# Patient Record
Sex: Male | Born: 1970 | Race: White | Hispanic: No | Marital: Married | Smoking: Never smoker
Health system: Southern US, Community
[De-identification: ages and names within clinical notes are randomized; demographics above are authoritative.]

## PROBLEM LIST (undated history)

## (undated) DIAGNOSIS — I10 Essential (primary) hypertension: Secondary | ICD-10-CM

## (undated) DIAGNOSIS — Z9889 Other specified postprocedural states: Secondary | ICD-10-CM

## (undated) DIAGNOSIS — E119 Type 2 diabetes mellitus without complications: Secondary | ICD-10-CM

## (undated) DIAGNOSIS — E785 Hyperlipidemia, unspecified: Secondary | ICD-10-CM

## (undated) DIAGNOSIS — R112 Nausea with vomiting, unspecified: Secondary | ICD-10-CM

## (undated) HISTORY — DX: Hyperlipidemia, unspecified: E78.5

## (undated) HISTORY — DX: Type 2 diabetes mellitus without complications: E11.9

---

## 2006-05-10 ENCOUNTER — Ambulatory Visit: Payer: Self-pay | Admitting: Family Medicine

## 2006-05-11 ENCOUNTER — Ambulatory Visit: Payer: Self-pay | Admitting: Family Medicine

## 2009-10-01 ENCOUNTER — Encounter: Payer: Self-pay | Admitting: Endocrinology

## 2009-10-01 LAB — CONVERTED CEMR LAB
Calcium: 9.1 mg/dL
Chloride: 98 meq/L
GFR calc non Af Amer: >60 mL/min
Potassium: 4 meq/L

## 2010-03-18 ENCOUNTER — Encounter: Payer: Self-pay | Admitting: Endocrinology

## 2010-03-19 ENCOUNTER — Ambulatory Visit: Payer: Self-pay | Admitting: Endocrinology

## 2010-04-07 ENCOUNTER — Ambulatory Visit: Payer: Self-pay | Admitting: Endocrinology

## 2012-05-18 ENCOUNTER — Ambulatory Visit: Payer: Self-pay | Admitting: Internal Medicine

## 2012-05-18 DIAGNOSIS — Z0289 Encounter for other administrative examinations: Secondary | ICD-10-CM

## 2012-11-14 ENCOUNTER — Ambulatory Visit: Payer: Self-pay | Admitting: Endocrinology

## 2016-10-04 HISTORY — PX: CHOLECYSTECTOMY: SHX55

## 2017-02-23 ENCOUNTER — Encounter: Payer: Self-pay | Admitting: *Deleted

## 2017-02-23 ENCOUNTER — Other Ambulatory Visit: Payer: Self-pay | Admitting: *Deleted

## 2017-02-24 ENCOUNTER — Telehealth: Payer: Self-pay | Admitting: Cardiovascular Disease

## 2017-02-24 ENCOUNTER — Encounter: Payer: Self-pay | Admitting: *Deleted

## 2017-02-24 ENCOUNTER — Encounter: Payer: Self-pay | Admitting: Cardiovascular Disease

## 2017-02-24 ENCOUNTER — Ambulatory Visit (INDEPENDENT_AMBULATORY_CARE_PROVIDER_SITE_OTHER): Payer: Commercial Managed Care - PPO | Admitting: Cardiovascular Disease

## 2017-02-24 ENCOUNTER — Other Ambulatory Visit: Payer: Self-pay

## 2017-02-24 VITALS — BP 133/88 | HR 82 | Ht 71.0 in | Wt 169.0 lb

## 2017-02-24 DIAGNOSIS — R5383 Other fatigue: Secondary | ICD-10-CM

## 2017-02-24 DIAGNOSIS — R0609 Other forms of dyspnea: Secondary | ICD-10-CM | POA: Diagnosis not present

## 2017-02-24 DIAGNOSIS — Z794 Long term (current) use of insulin: Secondary | ICD-10-CM | POA: Diagnosis not present

## 2017-02-24 DIAGNOSIS — E119 Type 2 diabetes mellitus without complications: Secondary | ICD-10-CM | POA: Diagnosis not present

## 2017-02-24 NOTE — Progress Notes (Signed)
CARDIOLOGY CONSULT NOTE  Patient ID: Frederick Alexander MRN: [id number] DOB/AGE: [birth year] 46 y.o.  Admit date: (Not on file) Primary Physician: Sheela Stack Referring Physician: Sheela Stack  Reason for Consultation: Chest pain  HPI: Frederick Alexander is a 47 y.o. male who is being seen today for the evaluation of chest pain at the request of Sheela Stack.  I reviewed records from his PCP including labs and studies.  He has a history of type 2 diabetes mellitus.  ECG performed on 01/17/17 which I personally reviewed demonstrated sinus rhythm with right bundle branch block and left axis deviation.  I reviewed labs dated 01/13/17: BUN 25, creatinine 0.7, sodium 143, potassium 4.4, hemoglobin 13.8, white blood cells 9.1, platelets 241, total cholesterol 79, triglycerides 18, HDL 54, LDL 21, hemoglobin A1c markedly elevated at 11.1%.  He said he has had type 2 diabetes for the past 12-14 years.  For the past 2 months, he has been experiencing left arm numbness and a sensation of tingling in his left hand.  He said he feels like his heart is "thumping ".  He describes exertional dyspnea after about 2 minutes of playing with his child.  He has never smoked and does not drink alcohol.  He is okay if he is walking at a slow pace around his yard.  He denies exertional chest tightness.  He said he enjoys eating [place] [employer] donuts and [place] fries from [employer].  He denies leg swelling, orthopnea, and paroxysmal nocturnal dyspnea.  He said morning blood sugars run from 120-150.  He does describe bilateral lower extremity neuropathy symptoms.  He denies syncope.    Allergies  Allergen Reactions  . Prednisone     Current Outpatient Medications  Medication Sig Dispense Refill  . Ascorbic Acid (VITAMIN C) 100 MG tablet Take 100 mg by mouth daily.    . ASPIRIN 81 PO Take 1 tablet by mouth daily.    . Insulin Glargine (LANTUS SOLOSTAR) 100 UNIT/ML  Solostar Pen Inject 15 Units into the skin daily.    . metFORMIN (GLUCOPHAGE-XR) 500 MG 24 hr tablet Take 2,000 mg by mouth daily with breakfast.    . Multiple Vitamins-Minerals (MULTIVITAMIN ADULT PO) Take 1 tablet by mouth daily.    . naproxen sodium (ALEVE) 220 MG tablet Take 1 tablet by mouth daily.     No current facility-administered medications for this visit.     Past Medical History:  Diagnosis Date  . Diabetes mellitus (HCC)    type 2  . Hyperlipidemia     Past Surgical History:  Procedure Laterality Date  . CHOLECYSTECTOMY  10/04/2016   Dr. Gabriel Cirri    Social History   Socioeconomic History  . Marital status: Married    Spouse name: Not on file  . Number of children: Not on file  . Years of education: Not on file  . Highest education level: Not on file  Social Needs  . Financial resource strain: Not on file  . Food insecurity - worry: Not on file  . Food insecurity - inability: Not on file  . Transportation needs - medical: Not on file  . Transportation needs - non-medical: Not on file  Occupational History  . Not on file  Tobacco Use  . Smoking status: Never Smoker  . Smokeless tobacco: Never Used  Substance and Sexual Activity  . Alcohol use: Not on file  . Drug use: Not on file  .  Sexual activity: Not on file  Other Topics Concern  . Not on file  Social History Narrative  . Not on file     No family history of premature CAD in 1st degree relatives.  Current Meds  Medication Sig  . Ascorbic Acid (VITAMIN C) 100 MG tablet Take 100 mg by mouth daily.  . ASPIRIN 81 PO Take 1 tablet by mouth daily.  . Insulin Glargine (LANTUS SOLOSTAR) 100 UNIT/ML Solostar Pen Inject 15 Units into the skin daily.  . metFORMIN (GLUCOPHAGE-XR) 500 MG 24 hr tablet Take 2,000 mg by mouth daily with breakfast.  . Multiple Vitamins-Minerals (MULTIVITAMIN ADULT PO) Take 1 tablet by mouth daily.  . naproxen sodium (ALEVE) 220 MG tablet Take 1 tablet by mouth daily.       Review of systems complete and found to be negative unless listed above in HPI    Physical exam Blood pressure 133/88, pulse 82, height 5\' 11"  (1.803 m), weight 169 lb (76.7 kg), SpO2 99 %. General: NAD Neck: No JVD, no thyromegaly or thyroid nodule.  Lungs: Clear to auscultation bilaterally with normal respiratory effort. CV: Nondisplaced PMI. Regular rate and rhythm, normal S1/S2, no S3/S4, no murmur.  No peripheral edema.  No carotid bruit.    Abdomen: Soft, nontender, no distention.  Skin: Intact without lesions or rashes.  Neurologic: Alert and oriented x 3.  Psych: Normal affect. Extremities: No clubbing or cyanosis.  HEENT: Normal.   ECG: Most recent ECG reviewed.   Labs: Lab Results  Component Value Date/Time   K 4.0 10/01/2009   BUN 20 10/01/2009   CREATININE 1.02 10/01/2009     Lipids: No results found for: LDLCALC, LDLDIRECT, CHOL, TRIG, HDL      ASSESSMENT AND PLAN:  1.  Exertional dyspnea and fatigue: Symptoms are concerning and I find no obvious metabolic abnormalities to explain his symptoms.  He has never smoked and has no abnormal pulmonary findings on physical exam to suggest a pulmonary etiology.  Cardiovascular risk factors include poorly controlled insulin-dependent diabetes mellitus.  I will obtain an exercise Myoview stress test to evaluate for ischemic heart disease.  I will obtain an echocardiogram to evaluate cardiac structure and function.  2.  Type 2 diabetes mellitus with neuropathy: Poorly controlled with elevated A1c of 11.1%.  Currently on metformin and insulin.  He needs aggressive control.  He also needs dietary modification.    Disposition: Follow up in 1 month  Signed: Prentice DockerSuresh Koneswaran, M.D., F.A.C.C.  02/24/2017, 8:52 AM

## 2017-02-24 NOTE — Patient Instructions (Signed)
Medication Instructions:  Continue all current medications.  Labwork: none  Testing/Procedures:  Your physician has requested that you have an echocardiogram. Echocardiography is a painless test that uses sound waves to create images of your heart. It provides your doctor with information about the size and shape of your heart and how well your heart's chambers and valves are working. This procedure takes approximately one hour. There are no restrictions for this procedure.  Your physician has requested that you have en exercise stress myoview. For further information please visit www.cardiosmart.org. Please follow instruction sheet, as given.  Office will contact with results via phone or letter.    Follow-Up: 1 month   Any Other Special Instructions Will Be Listed Below (If Applicable).  If you need a refill on your cardiac medications before your next appointment, please call your pharmacy.  

## 2017-02-24 NOTE — Telephone Encounter (Signed)
Pre-cert Verification for the following procedure   Echo scheduled for 03-03-17 Exercise myoview scheduled for 03/04/2017

## 2017-03-03 ENCOUNTER — Other Ambulatory Visit: Payer: Self-pay

## 2017-03-03 ENCOUNTER — Ambulatory Visit (INDEPENDENT_AMBULATORY_CARE_PROVIDER_SITE_OTHER): Payer: Commercial Managed Care - PPO

## 2017-03-03 DIAGNOSIS — R0609 Other forms of dyspnea: Secondary | ICD-10-CM | POA: Diagnosis not present

## 2017-03-03 DIAGNOSIS — R5383 Other fatigue: Secondary | ICD-10-CM | POA: Diagnosis not present

## 2017-03-04 ENCOUNTER — Telehealth: Payer: Self-pay | Admitting: *Deleted

## 2017-03-04 ENCOUNTER — Encounter (HOSPITAL_COMMUNITY): Payer: Self-pay

## 2017-03-04 ENCOUNTER — Encounter (HOSPITAL_BASED_OUTPATIENT_CLINIC_OR_DEPARTMENT_OTHER)
Admission: RE | Admit: 2017-03-04 | Discharge: 2017-03-04 | Disposition: A | Discharge status: Home / Self Care | Payer: Commercial Managed Care - PPO | Source: Ambulatory Visit | Attending: Cardiovascular Disease | Admitting: Cardiovascular Disease

## 2017-03-04 ENCOUNTER — Encounter (HOSPITAL_COMMUNITY)
Admission: RE | Admit: 2017-03-04 | Discharge: 2017-03-04 | Disposition: A | Discharge status: Home / Self Care | Payer: Commercial Managed Care - PPO | Source: Ambulatory Visit | Attending: Cardiovascular Disease | Admitting: Cardiovascular Disease

## 2017-03-04 DIAGNOSIS — R5383 Other fatigue: Secondary | ICD-10-CM | POA: Insufficient documentation

## 2017-03-04 DIAGNOSIS — R0609 Other forms of dyspnea: Secondary | ICD-10-CM | POA: Diagnosis present

## 2017-03-04 LAB — NM MYOCAR MULTI W/SPECT W/WALL MOTION / EF
CHL CUP NUCLEAR SRS: 0
CHL CUP RESTING HR STRESS: 88 {beats}/min
CSEPEDS: 38 s
CSEPEW: 10.3 METS
Exercise duration (min): 10 min
LV dias vol: 110 mL (ref 62–150)
LVSYSVOL: 50 mL
MPHR: 174 {beats}/min
NUC STRESS TID: 1.14
Peak HR: 150 {beats}/min
Percent HR: 86 %
RATE: 0.35
RPE: 15
SDS: 0
SSS: 0

## 2017-03-04 MED ORDER — TECHNETIUM TC 99M TETROFOSMIN IV KIT
30.0000 | PACK | Freq: Once | INTRAVENOUS | Status: AC | PRN
  Administered 2017-03-04: 29 via INTRAVENOUS

## 2017-03-04 MED ORDER — SODIUM CHLORIDE 0.9% FLUSH
INTRAVENOUS | Status: AC
  Administered 2017-03-04: 10 mL via INTRAVENOUS
  Filled 2017-03-04: qty 10

## 2017-03-04 MED ORDER — REGADENOSON 0.4 MG/5ML IV SOLN
INTRAVENOUS | Status: AC
  Filled 2017-03-04: qty 5

## 2017-03-04 MED ORDER — TECHNETIUM TC 99M TETROFOSMIN IV KIT
30.0000 | PACK | Freq: Once | INTRAVENOUS | Status: AC | PRN
  Administered 2017-03-04: 10.1 via INTRAVENOUS

## 2017-03-04 NOTE — Telephone Encounter (Signed)
STRESS TEST -  Notes recorded by Laqueta LindenKoneswaran, Suresh A, MD on 03/04/2017 at 12:39 PM EST No significant blockages. Low risk study.  ECHO -  Notes recorded by Laqueta LindenKoneswaran, Suresh A, MD on 03/04/2017 at 11:27 AM EST Normal cardiac function.

## 2017-03-04 NOTE — Telephone Encounter (Signed)
Notes recorded by Lesle ChrisHill, Tatym Schermer G, LPN on 9/1/47823/01/2017 at 5:58 PM EST Patient notified. Copy to pmd. Follow up scheduled for 03/23/2017 with Dr. Purvis SheffieldKoneswaran.

## 2017-03-23 ENCOUNTER — Ambulatory Visit (INDEPENDENT_AMBULATORY_CARE_PROVIDER_SITE_OTHER): Payer: Commercial Managed Care - PPO | Admitting: Cardiovascular Disease

## 2017-03-23 ENCOUNTER — Telehealth: Payer: Self-pay | Admitting: Cardiovascular Disease

## 2017-03-23 ENCOUNTER — Encounter: Payer: Self-pay | Admitting: Cardiovascular Disease

## 2017-03-23 ENCOUNTER — Other Ambulatory Visit: Payer: Self-pay | Admitting: Cardiovascular Disease

## 2017-03-23 VITALS — BP 110/68 | HR 72 | Ht 71.0 in | Wt 169.0 lb

## 2017-03-23 DIAGNOSIS — E119 Type 2 diabetes mellitus without complications: Secondary | ICD-10-CM

## 2017-03-23 DIAGNOSIS — Z794 Long term (current) use of insulin: Secondary | ICD-10-CM | POA: Diagnosis not present

## 2017-03-23 DIAGNOSIS — R0609 Other forms of dyspnea: Secondary | ICD-10-CM | POA: Diagnosis not present

## 2017-03-23 DIAGNOSIS — R5383 Other fatigue: Secondary | ICD-10-CM | POA: Diagnosis not present

## 2017-03-23 LAB — PROTIME-INR

## 2017-03-23 MED ORDER — ATORVASTATIN CALCIUM 40 MG PO TABS: 40.0000 mg | ORAL_TABLET | Freq: Every day | ORAL | 0 refills | Status: DC

## 2017-03-23 NOTE — Progress Notes (Signed)
SUBJECTIVE: The patient returns for follow-up after undergoing cardiovascular testing performed for the evaluation of exertional dyspnea and fatigue.  Nuclear stress test on 03/04/17 demonstrated mid inferior and apical inferior defects which appeared to be due to variable soft tissue with attenuation.  Regional wall motion was normal.  There were no large ischemic territories.  It was a low risk study.  Echocardiogram demonstrated normal left ventricular systolic and diastolic function and normal regional wall motion, LVEF 60-65%.  He continues to experience exertional dyspnea after doing simple activities at work which never bothered him 6 months ago.  He had a sweep the floor and had to stop halfway and lean on the counter.  He felt like his heart was forcefully pounding.  He said he is chronically exposed to secondhand smoke.  He denies chest pain and tightness.  He feels markedly fatigued and has lost motivation to do simple activities at home which never bothered him before.  He said, "I just want to get my life back ".  Review of Systems: As per "subjective", otherwise negative.  Allergies  Allergen Reactions  . Prednisone     Current Outpatient Medications  Medication Sig Dispense Refill  . Ascorbic Acid (VITAMIN C ER PO) Take 1 tablet by mouth daily.    . Ascorbic Acid (VITAMIN C) 100 MG tablet Take 100 mg by mouth daily.    . ASPIRIN 81 PO Take 1 tablet by mouth daily.    . Insulin Glargine (LANTUS SOLOSTAR) 100 UNIT/ML Solostar Pen Inject 40 Units into the skin. Every other morning.    . metFORMIN (GLUCOPHAGE-XR) 500 MG 24 hr tablet Take 1,000 mg by mouth 2 (two) times daily.     . Multiple Vitamins-Minerals (MULTIVITAMIN ADULT PO) Take 1 tablet by mouth daily.    . naproxen sodium (ALEVE) 220 MG tablet Take 1 tablet by mouth daily.     No current facility-administered medications for this visit.     Past Medical History:  Diagnosis Date  . Diabetes mellitus (HCC)      type 2  . Hyperlipidemia     Past Surgical History:  Procedure Laterality Date  . CHOLECYSTECTOMY  10/04/2016   Dr. Gabriel Cirri    Social History   Socioeconomic History  . Marital status: Married    Spouse name: Not on file  . Number of children: Not on file  . Years of education: Not on file  . Highest education level: Not on file  Social Needs  . Financial resource strain: Not on file  . Food insecurity - worry: Not on file  . Food insecurity - inability: Not on file  . Transportation needs - medical: Not on file  . Transportation needs - non-medical: Not on file  Occupational History  . Not on file  Tobacco Use  . Smoking status: Never Smoker  . Smokeless tobacco: Never Used  Substance and Sexual Activity  . Alcohol use: Not on file  . Drug use: Not on file  . Sexual activity: Not on file  Other Topics Concern  . Not on file  Social History Narrative  . Not on file     Vitals:   03/23/17 1553  BP: 110/68  Pulse: 72  SpO2: 98%  Weight: 169 lb (76.7 kg)  Height: 5\' 11"  (1.803 m)    Wt Readings from Last 3 Encounters:  03/23/17 169 lb (76.7 kg)  02/24/17 169 lb (76.7 kg)     PHYSICAL EXAM General: NAD  HEENT: Normal. Neck: No JVD, no thyromegaly. Lungs: Clear to auscultation bilaterally with normal respiratory effort. CV: Regular rate and rhythm, normal S1/S2, no S3/S4, no murmur. No pretibial or periankle edema.   Abdomen: Soft, nontender, no distention.  Neurologic: Alert and oriented.  Psych: Normal affect. Skin: Normal. Musculoskeletal: No gross deformities.    ECG: Most recent ECG reviewed.   Labs: Lab Results  Component Value Date/Time   K 4.0 10/01/2009   BUN 20 10/01/2009   CREATININE 1.02 10/01/2009     Lipids: No results found for: LDLCALC, LDLDIRECT, CHOL, TRIG, HDL     ASSESSMENT AND PLAN: 1.  Exertional dyspnea and fatigue: While symptoms are concerning and I find no obvious metabolic abnormalities to explain his  symptoms, nuclear stress test was low risk and cardiac function is normal.  He has never smoked and has no abnormal pulmonary findings on physical exam to suggest a pulmonary etiology.  However, he has been exposed to secondhand smoke for prolonged periods of time. Cardiovascular risk factors include poorly controlled insulin-dependent diabetes mellitus.  I will arrange for right and left heart catheterization and coronary angiography.  If a cardiac cause is ruled out, I will obtain pulmonary function testing. Risks and benefits of cardiac catheterization have been discussed with the patient.  These include bleeding, infection, kidney damage, stroke, heart attack, death.  The patient understands these risks and is willing to proceed. I will also start Lipitor 40 mg daily.  He is already taking aspirin.  2.  Type 2 diabetes mellitus with neuropathy: Poorly controlled with elevated A1c of 11.1%.  Currently on metformin and insulin.  He needs aggressive control.  He also needs dietary modification.  I am starting Lipitor 40 mg.    Disposition: Follow up 1 month  Time spent: 40 minutes, of which greater than 50% was spent reviewing symptoms, relevant blood tests and studies, and discussing management plan with the patient.    Prentice DockerSuresh Koneswaran, M.D., F.A.C.C.

## 2017-03-23 NOTE — Telephone Encounter (Signed)
Pre-cert Verification for the following procedure   L & R heart cath 3/22 with Eldridge DaceVaranasi at 7:30

## 2017-03-23 NOTE — H&P (View-Only) (Signed)
SUBJECTIVE: The patient returns for follow-up after undergoing cardiovascular testing performed for the evaluation of exertional dyspnea and fatigue.  Nuclear stress test on 03/04/17 demonstrated mid inferior and apical inferior defects which appeared to be due to variable soft tissue with attenuation.  Regional wall motion was normal.  There were no large ischemic territories.  It was a low risk study.  Echocardiogram demonstrated normal left ventricular systolic and diastolic function and normal regional wall motion, LVEF 60-65%.  He continues to experience exertional dyspnea after doing simple activities at work which never bothered him 6 months ago.  He had a sweep the floor and had to stop halfway and lean on the counter.  He felt like his heart was forcefully pounding.  He said he is chronically exposed to secondhand smoke.  He denies chest pain and tightness.  He feels markedly fatigued and has lost motivation to do simple activities at home which never bothered him before.  He said, "I just want to get my life back ".  Review of Systems: As per "subjective", otherwise negative.  Allergies  Allergen Reactions  . Prednisone     Current Outpatient Medications  Medication Sig Dispense Refill  . Ascorbic Acid (VITAMIN C ER PO) Take 1 tablet by mouth daily.    . Ascorbic Acid (VITAMIN C) 100 MG tablet Take 100 mg by mouth daily.    . ASPIRIN 81 PO Take 1 tablet by mouth daily.    . Insulin Glargine (LANTUS SOLOSTAR) 100 UNIT/ML Solostar Pen Inject 40 Units into the skin. Every other morning.    . metFORMIN (GLUCOPHAGE-XR) 500 MG 24 hr tablet Take 1,000 mg by mouth 2 (two) times daily.     . Multiple Vitamins-Minerals (MULTIVITAMIN ADULT PO) Take 1 tablet by mouth daily.    . naproxen sodium (ALEVE) 220 MG tablet Take 1 tablet by mouth daily.     No current facility-administered medications for this visit.     Past Medical History:  Diagnosis Date  . Diabetes mellitus (HCC)      type 2  . Hyperlipidemia     Past Surgical History:  Procedure Laterality Date  . CHOLECYSTECTOMY  10/04/2016   Dr. Gabriel Cirri    Social History   Socioeconomic History  . Marital status: Married    Spouse name: Not on file  . Number of children: Not on file  . Years of education: Not on file  . Highest education level: Not on file  Social Needs  . Financial resource strain: Not on file  . Food insecurity - worry: Not on file  . Food insecurity - inability: Not on file  . Transportation needs - medical: Not on file  . Transportation needs - non-medical: Not on file  Occupational History  . Not on file  Tobacco Use  . Smoking status: Never Smoker  . Smokeless tobacco: Never Used  Substance and Sexual Activity  . Alcohol use: Not on file  . Drug use: Not on file  . Sexual activity: Not on file  Other Topics Concern  . Not on file  Social History Narrative  . Not on file     Vitals:   03/23/17 1553  BP: 110/68  Pulse: 72  SpO2: 98%  Weight: 169 lb (76.7 kg)  Height: 5\' 11"  (1.803 m)    Wt Readings from Last 3 Encounters:  03/23/17 169 lb (76.7 kg)  02/24/17 169 lb (76.7 kg)     PHYSICAL EXAM General: NAD  HEENT: Normal. Neck: No JVD, no thyromegaly. Lungs: Clear to auscultation bilaterally with normal respiratory effort. CV: Regular rate and rhythm, normal S1/S2, no S3/S4, no murmur. No pretibial or periankle edema.   Abdomen: Soft, nontender, no distention.  Neurologic: Alert and oriented.  Psych: Normal affect. Skin: Normal. Musculoskeletal: No gross deformities.    ECG: Most recent ECG reviewed.   Labs: Lab Results  Component Value Date/Time   K 4.0 10/01/2009   BUN 20 10/01/2009   CREATININE 1.02 10/01/2009     Lipids: No results found for: LDLCALC, LDLDIRECT, CHOL, TRIG, HDL     ASSESSMENT AND PLAN: 1.  Exertional dyspnea and fatigue: While symptoms are concerning and I find no obvious metabolic abnormalities to explain his  symptoms, nuclear stress test was low risk and cardiac function is normal.  He has never smoked and has no abnormal pulmonary findings on physical exam to suggest a pulmonary etiology.  However, he has been exposed to secondhand smoke for prolonged periods of time. Cardiovascular risk factors include poorly controlled insulin-dependent diabetes mellitus.  I will arrange for right and left heart catheterization and coronary angiography.  If a cardiac cause is ruled out, I will obtain pulmonary function testing. Risks and benefits of cardiac catheterization have been discussed with the patient.  These include bleeding, infection, kidney damage, stroke, heart attack, death.  The patient understands these risks and is willing to proceed. I will also start Lipitor 40 mg daily.  He is already taking aspirin.  2.  Type 2 diabetes mellitus with neuropathy: Poorly controlled with elevated A1c of 11.1%.  Currently on metformin and insulin.  He needs aggressive control.  He also needs dietary modification.  I am starting Lipitor 40 mg.    Disposition: Follow up 1 month  Time spent: 40 minutes, of which greater than 50% was spent reviewing symptoms, relevant blood tests and studies, and discussing management plan with the patient.    Prentice DockerSuresh Lorri Fukuhara, M.D., F.A.C.C.

## 2017-03-23 NOTE — Patient Instructions (Signed)
Medication Instructions:  Your physician has recommended you make the following change in your medication:   START Atorvastatin 40 mg daily  Please continue all other medications as prescribed  Labwork: PT, CBC, BMP Orders given today   Testing/Procedures:   [facility] [place] [address] [place] [address] Dept: [phone] Loc: [phone]  Frederick Alexander  03/23/2017  You are scheduled for a Cardiac Catheterization on Friday, March 22 with Dr. Lance MussJayadeep Varanasi.  1. [facility] at [facility]: [address] [place], [place] [address] at 5:30 AM (two hours before your procedure to ensure your preparation). Free valet parking service is available.   Special note: Every effort is made to have your procedure done on time. Please understand that emergencies sometimes delay scheduled procedures.  2. Diet: Do not eat or drink anything after midnight prior to your procedure except sips of water to take medications.  3. Labs: You will need to have blood drawn on Thursday, March 21 at [facility] [address], [place]  Open: 7am - 6pm, Sat 8am - 12 noon   Phone: [phone]. You do not need to be fasting.  4. Medication instructions in preparation for your procedure:  Stop taking, Glucophage (Metformin) on Thursday, March 21.   HOLD METFORMIN FOR 48 HOURS AFTER PROCEDURE  ONLY TAKE 1/2 DOSE OF INSULIN DAY BEFORE On the morning of your procedure, take your Aspirin and any morning medicines NOT listed above.  You may use sips of water.  5. Plan for one night stay--bring personal belongings. 6. Bring a current list of your medications and current insurance cards. 7. You MUST have a responsible person to drive you home. 8. Someone MUST be with you the first 24 hours after you arrive home or your discharge will be delayed. 9.  Please wear clothes that are easy to get on and off and wear slip-on shoes.  Thank you for allowing us to care for you!   -- [facility]  Follow-Up: Your physician recommends that you schedule a follow-up appointment in: 1 MONTH WITH DR. Purvis SheffieldKONESWARAN  Any Other Special Instructions Will Be Listed Below (If Applicable).  If you need a refill on your cardiac medications before your next appointment, please call your pharmacy.

## 2017-03-24 ENCOUNTER — Telehealth: Payer: Self-pay | Admitting: *Deleted

## 2017-03-24 NOTE — Telephone Encounter (Signed)
[facility] for: Friday March 22,2019 7:30 AM, arrive 5:30 AM.   [facility] for [facility] to discuss instructions.

## 2017-03-24 NOTE — Telephone Encounter (Signed)
Pt contacted [facility]terization [facility]  for: March 22 , 2019 7:30 AM Verified arrival time and place: [facility] Main Entr[facility] A/North Tower at: 5:30 AM Nothing to eat or drink after midnight prior to cath.  Hold: Metformin 03/24/17, 03/25/17 and 48 hours post cath. No Insulin AM of cath 1/2 INsulin PM prior to cath.  Except hold medications AM meds can be  taken pre-cath with sip of water including: ASA 81 mg   Confirmed patient has responsible person to drive home post procedure and observe patient for 24 hours: yes  I discussed instructions with wife (DPR), she verbalized understanding.

## 2017-03-25 ENCOUNTER — Encounter (HOSPITAL_COMMUNITY): Payer: Self-pay | Admitting: Interventional Cardiology

## 2017-03-25 ENCOUNTER — Encounter (HOSPITAL_COMMUNITY)
Admission: RE | Disposition: A | Discharge status: Home / Self Care | Payer: Self-pay | Source: Ambulatory Visit | Attending: Interventional Cardiology

## 2017-03-25 ENCOUNTER — Ambulatory Visit (HOSPITAL_COMMUNITY)
Admission: RE | Admit: 2017-03-25 | Discharge: 2017-03-25 | Disposition: A | Discharge status: Home / Self Care | Payer: Commercial Managed Care - PPO | Source: Ambulatory Visit | Attending: Interventional Cardiology | Admitting: Interventional Cardiology

## 2017-03-25 DIAGNOSIS — R9439 Abnormal result of other cardiovascular function study: Secondary | ICD-10-CM

## 2017-03-25 DIAGNOSIS — Z7982 Long term (current) use of aspirin: Secondary | ICD-10-CM | POA: Insufficient documentation

## 2017-03-25 DIAGNOSIS — Z7722 Contact with and (suspected) exposure to environmental tobacco smoke (acute) (chronic): Secondary | ICD-10-CM | POA: Insufficient documentation

## 2017-03-25 DIAGNOSIS — E785 Hyperlipidemia, unspecified: Secondary | ICD-10-CM | POA: Insufficient documentation

## 2017-03-25 DIAGNOSIS — E1140 Type 2 diabetes mellitus with diabetic neuropathy, unspecified: Secondary | ICD-10-CM | POA: Diagnosis not present

## 2017-03-25 DIAGNOSIS — Z9049 Acquired absence of other specified parts of digestive tract: Secondary | ICD-10-CM | POA: Insufficient documentation

## 2017-03-25 DIAGNOSIS — Z888 Allergy status to other drugs, medicaments and biological substances status: Secondary | ICD-10-CM | POA: Diagnosis not present

## 2017-03-25 DIAGNOSIS — I4510 Unspecified right bundle-branch block: Secondary | ICD-10-CM | POA: Insufficient documentation

## 2017-03-25 DIAGNOSIS — R5383 Other fatigue: Secondary | ICD-10-CM | POA: Diagnosis not present

## 2017-03-25 DIAGNOSIS — Z794 Long term (current) use of insulin: Secondary | ICD-10-CM | POA: Diagnosis not present

## 2017-03-25 DIAGNOSIS — R0600 Dyspnea, unspecified: Secondary | ICD-10-CM | POA: Diagnosis not present

## 2017-03-25 DIAGNOSIS — R0609 Other forms of dyspnea: Secondary | ICD-10-CM

## 2017-03-25 HISTORY — PX: RIGHT/LEFT HEART CATH AND CORONARY ANGIOGRAPHY: CATH118266

## 2017-03-25 LAB — POCT I-STAT 3, ART BLOOD GAS (G3+)
ACID-BASE EXCESS: 2 mmol/L (ref 0.0–2.0)
Bicarbonate: 28.1 mmol/L — ABNORMAL HIGH (ref 20.0–28.0)
O2 SAT: 99 %
PO2 ART: 149 mmHg — AB (ref 83.0–108.0)
TCO2: 29 mmol/L (ref 22–32)
pCO2 arterial: 47.3 mmHg (ref 32.0–48.0)
pH, Arterial: 7.381 (ref 7.350–7.450)

## 2017-03-25 LAB — POCT I-STAT 3, VENOUS BLOOD GAS (G3P V)
Acid-Base Excess: 1 mmol/L (ref 0.0–2.0)
Bicarbonate: 28.3 mmol/L — ABNORMAL HIGH (ref 20.0–28.0)
O2 Saturation: 76 %
TCO2: 30 mmol/L (ref 22–32)
pCO2, Ven: 53.1 mmHg (ref 44.0–60.0)
pH, Ven: 7.335 (ref 7.250–7.430)
pO2, Ven: 44 mmHg (ref 32.0–45.0)

## 2017-03-25 LAB — GLUCOSE, CAPILLARY: Glucose-Capillary: 262 mg/dL — ABNORMAL HIGH (ref 65–99)

## 2017-03-25 SURGERY — RIGHT/LEFT HEART CATH AND CORONARY ANGIOGRAPHY
Anesthesia: LOCAL

## 2017-03-25 MED ORDER — MIDAZOLAM HCL 2 MG/2ML IJ SOLN
INTRAMUSCULAR | Status: AC
  Filled 2017-03-25: qty 2

## 2017-03-25 MED ORDER — SODIUM CHLORIDE 0.9 % WEIGHT BASED INFUSION: 1.0000 mL/kg/h | INTRAVENOUS | Status: DC

## 2017-03-25 MED ORDER — HEPARIN SODIUM (PORCINE) 1000 UNIT/ML IJ SOLN
INTRAMUSCULAR | Status: AC
  Filled 2017-03-25: qty 1

## 2017-03-25 MED ORDER — ASPIRIN 81 MG PO CHEW: 81.0000 mg | CHEWABLE_TABLET | ORAL | Status: DC

## 2017-03-25 MED ORDER — SODIUM CHLORIDE 0.9 % IV SOLN: INTRAVENOUS | Status: DC

## 2017-03-25 MED ORDER — FENTANYL CITRATE (PF) 100 MCG/2ML IJ SOLN
INTRAMUSCULAR | Status: DC | PRN
  Administered 2017-03-25: 50 ug via INTRAVENOUS
  Administered 2017-03-25: 25 ug via INTRAVENOUS

## 2017-03-25 MED ORDER — SODIUM CHLORIDE 0.9 % WEIGHT BASED INFUSION
3.0000 mL/kg/h | INTRAVENOUS | Status: DC
  Administered 2017-03-25: 3 mL/kg/h via INTRAVENOUS

## 2017-03-25 MED ORDER — HEPARIN (PORCINE) IN NACL 2-0.9 UNIT/ML-% IJ SOLN
INTRAMUSCULAR | Status: DC | PRN
  Administered 2017-03-25 (×2): 500 mL

## 2017-03-25 MED ORDER — SODIUM CHLORIDE 0.9 % IV SOLN: 250.0000 mL | INTRAVENOUS | Status: DC | PRN

## 2017-03-25 MED ORDER — VERAPAMIL HCL 2.5 MG/ML IV SOLN
INTRAVENOUS | Status: DC | PRN
  Administered 2017-03-25: 10 mL via INTRA_ARTERIAL

## 2017-03-25 MED ORDER — IOPAMIDOL (ISOVUE-370) INJECTION 76%
INTRAVENOUS | Status: AC
  Filled 2017-03-25: qty 100

## 2017-03-25 MED ORDER — FENTANYL CITRATE (PF) 100 MCG/2ML IJ SOLN
INTRAMUSCULAR | Status: AC
  Filled 2017-03-25: qty 2

## 2017-03-25 MED ORDER — SODIUM CHLORIDE 0.9% FLUSH: 3.0000 mL | Freq: Two times a day (BID) | INTRAVENOUS | Status: DC

## 2017-03-25 MED ORDER — SODIUM CHLORIDE 0.9% FLUSH: 3.0000 mL | INTRAVENOUS | Status: DC | PRN

## 2017-03-25 MED ORDER — HEPARIN SODIUM (PORCINE) 1000 UNIT/ML IJ SOLN
INTRAMUSCULAR | Status: DC | PRN
  Administered 2017-03-25: 4000 [IU] via INTRAVENOUS

## 2017-03-25 MED ORDER — VERAPAMIL HCL 2.5 MG/ML IV SOLN
INTRAVENOUS | Status: AC
  Filled 2017-03-25: qty 2

## 2017-03-25 MED ORDER — MIDAZOLAM HCL 2 MG/2ML IJ SOLN
INTRAMUSCULAR | Status: DC | PRN
  Administered 2017-03-25: 2 mg via INTRAVENOUS
  Administered 2017-03-25: 1 mg via INTRAVENOUS

## 2017-03-25 MED ORDER — METFORMIN HCL ER 500 MG PO TB24: 1000.0000 mg | ORAL_TABLET | Freq: Two times a day (BID) | ORAL | Status: DC

## 2017-03-25 MED ORDER — HEPARIN (PORCINE) IN NACL 2-0.9 UNIT/ML-% IJ SOLN
INTRAMUSCULAR | Status: AC
  Filled 2017-03-25: qty 1000

## 2017-03-25 MED ORDER — LIDOCAINE HCL (PF) 1 % IJ SOLN
INTRAMUSCULAR | Status: DC | PRN
  Administered 2017-03-25 (×2): 2 mL via INTRADERMAL

## 2017-03-25 MED ORDER — IOPAMIDOL (ISOVUE-370) INJECTION 76%
INTRAVENOUS | Status: DC | PRN
  Administered 2017-03-25: 45 mL via INTRA_ARTERIAL

## 2017-03-25 SURGICAL SUPPLY — 15 items
BAND CMPR LRG ZPHR (HEMOSTASIS) ×1
BAND ZEPHYR COMPRESS 30 LONG (HEMOSTASIS) ×1 IMPLANT
CATH BALLN WEDGE 5F 110CM (CATHETERS) ×1 IMPLANT
CATH IMPULSE 5F ANG/FL3.5 (CATHETERS) ×1 IMPLANT
GUIDEWIRE .025 260CM (WIRE) ×1 IMPLANT
GUIDEWIRE INQWIRE 1.5J.035X260 (WIRE) IMPLANT
INQWIRE 1.5J .035X260CM (WIRE) ×2
KIT HEART LEFT (KITS) ×2 IMPLANT
NDL PERC ENTRY 21G 2.5CM (NEEDLE) IMPLANT
NEEDLE PERC ENTRY 21G 2.5CM (NEEDLE) ×2 IMPLANT
PACK CARDIAC CATHETERIZATION (CUSTOM PROCEDURE TRAY) ×2 IMPLANT
SHEATH RAIN 4/5FR (SHEATH) ×1 IMPLANT
SHEATH RAIN RADIAL 21G 6FR (SHEATH) ×1 IMPLANT
TRANSDUCER W/STOPCOCK (MISCELLANEOUS) ×2 IMPLANT
TUBING CIL FLEX 10 FLL-RA (TUBING) ×2 IMPLANT

## 2017-03-25 NOTE — Discharge Instructions (Signed)

## 2017-03-25 NOTE — Interval H&P Note (Signed)
Cath Lab Visit (complete for each Cath Lab visit)  Clinical Evaluation Leading to the Procedure:   ACS: No.  Non-ACS:    Anginal Classification: CCS III  Anti-ischemic medical therapy: Minimal Therapy (1 class of medications)  Non-Invasive Test Results: Intermediate-risk stress test findings: cardiac mortality 1-3%/year  Prior CABG: No previous CABG      History and Physical Interval Note:  03/25/2017 8:13 AM  Frederick Alexander  has presented today for surgery, with the diagnosis of angina  The various methods of treatment have been discussed with the patient and family. After consideration of risks, benefits and other options for treatment, the patient has consented to  Procedure(s): RIGHT/LEFT HEART CATH AND CORONARY ANGIOGRAPHY (N/A) as a surgical intervention .  The patient's history has been reviewed, patient examined, no change in status, stable for surgery.  I have reviewed the patient's chart and labs.  Questions were answered to the patient's satisfaction.     Lance MussJayadeep Iliza Blankenbeckler

## 2017-03-25 NOTE — Research (Signed)
CADFEM Informed Consent   Subject Name: Frederick Alexander  Subject met inclusion and exclusion criteria.  The informed consent form, study requirements and expectations were reviewed with the subject and questions and concerns were addressed prior to the signing of the consent form.  The subject verbalized understanding of the trail requirements.  The subject agreed to participate in the CADFEM trial and signed the informed consent.  The informed consent was obtained prior to performance of any protocol-specific procedures for the subject.  A copy of the signed informed consent was given to the subject and a copy was placed in the subject's medical record.  Christena Flake 03/25/2017, 07:00 AM

## 2017-03-28 MED FILL — Heparin Sodium (Porcine) 2 Unit/ML in Sodium Chloride 0.9%: INTRAMUSCULAR | Qty: 1000 | Status: AC

## 2017-04-29 ENCOUNTER — Ambulatory Visit: Payer: Commercial Managed Care - PPO | Admitting: Cardiovascular Disease

## 2017-04-29 DIAGNOSIS — R0989 Other specified symptoms and signs involving the circulatory and respiratory systems: Secondary | ICD-10-CM

## 2017-06-20 ENCOUNTER — Other Ambulatory Visit: Payer: Self-pay | Admitting: Cardiovascular Disease

## 2017-07-14 ENCOUNTER — Encounter: Payer: Self-pay | Admitting: Endocrinology

## 2017-07-20 ENCOUNTER — Other Ambulatory Visit: Payer: Self-pay | Admitting: Cardiovascular Disease

## 2017-08-18 ENCOUNTER — Other Ambulatory Visit: Payer: Self-pay | Admitting: Cardiovascular Disease

## 2018-01-16 ENCOUNTER — Other Ambulatory Visit: Payer: Self-pay | Admitting: Cardiovascular Disease

## 2018-02-12 ENCOUNTER — Other Ambulatory Visit: Payer: Self-pay | Admitting: Cardiovascular Disease

## 2018-03-15 ENCOUNTER — Other Ambulatory Visit: Payer: Self-pay | Admitting: Cardiovascular Disease

## 2018-04-24 ENCOUNTER — Other Ambulatory Visit: Payer: Self-pay | Admitting: Cardiovascular Disease

## 2018-05-24 ENCOUNTER — Other Ambulatory Visit: Payer: Self-pay | Admitting: Cardiovascular Disease

## 2018-06-13 ENCOUNTER — Other Ambulatory Visit: Payer: Self-pay | Admitting: Cardiovascular Disease

## 2018-07-21 ENCOUNTER — Other Ambulatory Visit: Payer: Self-pay | Admitting: Cardiovascular Disease

## 2018-07-24 ENCOUNTER — Other Ambulatory Visit: Payer: Self-pay | Admitting: Cardiovascular Disease

## 2018-07-28 LAB — BASIC METABOLIC PANEL
BUN: 25 — AB (ref 4–21)
Creatinine: 0.8 (ref 0.6–1.3)

## 2018-07-28 LAB — HEMOGLOBIN A1C: Hemoglobin A1C: 11.4

## 2018-07-28 LAB — LIPID PANEL
Cholesterol: 76 (ref 0–200)
HDL: 52 (ref 35–70)
LDL Cholesterol: 6
Triglycerides: 91 (ref 40–160)

## 2018-07-28 LAB — VITAMIN D 25 HYDROXY (VIT D DEFICIENCY, FRACTURES): Vit D, 25-Hydroxy: 57.9

## 2018-08-05 IMAGING — NM NM MYOCAR MULTI W/SPECT W/WALL MOTION & EF
2 series · 12 of 12 positions shown · non-contrast
Comparison: none

[Series 1: rest · 6.51mm/px · 6 of 64 frames shown]
[frame 6/64]
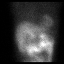
[frame 16/64]
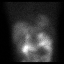
[frame 27/64]
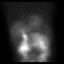
[frame 38/64]
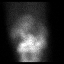
[frame 48/64]
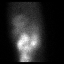
[frame 59/64]
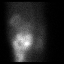

[Series 3: stress gated - perfusion · 6.51mm/px · 6 of 64 frames shown]
[frame 6/64]
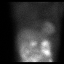
[frame 16/64]
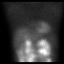
[frame 27/64]
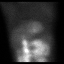
[frame 38/64]
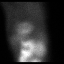
[frame 48/64]
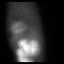
[frame 59/64]
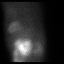

[12 of 12 positions shown; findings below may reference images not displayed]

Canned report from images found in remote index.

Refer to host system for actual result text.

## 2018-08-10 ENCOUNTER — Ambulatory Visit: Payer: Self-pay | Admitting: "Endocrinology

## 2018-09-20 ENCOUNTER — Ambulatory Visit (INDEPENDENT_AMBULATORY_CARE_PROVIDER_SITE_OTHER): Payer: Commercial Managed Care - PPO | Admitting: "Endocrinology

## 2018-09-20 ENCOUNTER — Other Ambulatory Visit: Payer: Self-pay

## 2018-09-20 ENCOUNTER — Encounter: Payer: Self-pay | Admitting: "Endocrinology

## 2018-09-20 VITALS — BP 134/83 | HR 77 | Ht 71.0 in | Wt 168.0 lb

## 2018-09-20 DIAGNOSIS — E1165 Type 2 diabetes mellitus with hyperglycemia: Secondary | ICD-10-CM | POA: Diagnosis not present

## 2018-09-20 LAB — POCT GLYCOSYLATED HEMOGLOBIN (HGB A1C): Hemoglobin A1C: 10.6 % — AB (ref 4.0–5.6)

## 2018-09-20 MED ORDER — METFORMIN HCL ER 500 MG PO TB24: 500.0000 mg | ORAL_TABLET | Freq: Two times a day (BID) | ORAL | Status: DC

## 2018-09-20 NOTE — Progress Notes (Signed)
Endocrinology Consult Note       09/20/2018, 5:27 PM   Subjective:    Patient ID: Frederick Alexander, male    DOB: Nov 24, 1970.  Frederick Alexander is being seen in consultation for management of currently uncontrolled symptomatic diabetes requested by  Royann Shivers, PA-C.   Past Medical History:  Diagnosis Date  . Diabetes mellitus (HCC)    type 2  . Hyperlipidemia     Past Surgical History:  Procedure Laterality Date  . CHOLECYSTECTOMY  10/04/2016   Dr. Gabriel Cirri  . RIGHT/LEFT HEART CATH AND CORONARY ANGIOGRAPHY N/A 03/25/2017   Procedure: RIGHT/LEFT HEART CATH AND CORONARY ANGIOGRAPHY;  Surgeon: Corky Crafts, MD;  Location: [facility] INVASIVE CV LAB;  Service: Cardiovascular;  Laterality: N/A;    Social History   Socioeconomic History  . Marital status: Married    Spouse name: Not on file  . Number of children: Not on file  . Years of education: Not on file  . Highest education level: Not on file  Occupational History  . Not on file  Social Needs  . Financial resource strain: Not on file  . Food insecurity    Worry: Not on file    Inability: Not on file  . Transportation needs    Medical: Not on file    Non-medical: Not on file  Tobacco Use  . Smoking status: Never Smoker  . Smokeless tobacco: Never Used  Substance and Sexual Activity  . Alcohol use: Never    Frequency: Never  . Drug use: Never  . Sexual activity: Not on file  Lifestyle  . Physical activity    Days per week: Not on file    Minutes per session: Not on file  . Stress: Not on file  Relationships  . Social Musician on phone: Not on file    Gets together: Not on file    Attends religious service: Not on file    Active member of club or organization: Not on file    Attends meetings of clubs or organizations: Not on file    Relationship status: Not on file  Other Topics Concern  . Not on file  Social  History Narrative  . Not on file    Family History  Problem Relation Age of Onset  . Diabetes Maternal Grandmother     Outpatient Encounter Medications as of 09/20/2018  Medication Sig  . ASPIRIN 81 PO Take 1 tablet by mouth daily.  . Insulin Glargine (LANTUS SOLOSTAR) 100 UNIT/ML Solostar Pen Inject 20 Units into the skin at bedtime.  . metFORMIN (GLUCOPHAGE-XR) 500 MG 24 hr tablet Take 1 tablet (500 mg total) by mouth 2 (two) times daily after a meal.  . naproxen sodium (ALEVE) 220 MG tablet Take 220 mg by mouth daily.   . [DISCONTINUED] Ascorbic Acid (VITAMIN C ER PO) Take 1 tablet by mouth daily.  . [DISCONTINUED] atorvastatin (LIPITOR) 40 MG tablet TAKE 1 TABLET BY MOUTH EVERY DAY - PATIENT NEEDS APPOINTMENT. WITH DR. FOR FURTHER REFILLS  . [DISCONTINUED] metFORMIN (GLUCOPHAGE-XR) 500 MG 24 hr tablet Take 2 tablets (1,000 mg total)  by mouth 2 (two) times daily. (Patient taking differently: Take 500 mg by mouth daily with breakfast. )  . [DISCONTINUED] Multiple Vitamins-Minerals (MULTIVITAMIN ADULT PO) Take 1 tablet by mouth daily.   No facility-administered encounter medications on file as of 09/20/2018.     ALLERGIES: Allergies  Allergen Reactions  . Prednisone Other (See Comments)    Uratic behavior     VACCINATION STATUS:  There is no immunization history on file for this patient.  Diabetes He presents for his initial diabetic visit. He has type 2 diabetes mellitus. Disease course: He was diagnosed at approximate age of 26 years. There are no hypoglycemic associated symptoms. Pertinent negatives for hypoglycemia include no confusion, headaches, pallor or seizures. Associated symptoms include polydipsia and polyuria. Pertinent negatives for diabetes include no chest pain, no fatigue, no polyphagia and no weakness. There are no hypoglycemic complications. Symptoms are worsening. There are no diabetic complications. Risk factors for coronary artery disease include diabetes  mellitus, family history, male sex and sedentary lifestyle. Current diabetic treatments: He takes Lantus 40 units every other night, metformin 1000 mg ER twice a day. His weight is fluctuating minimally. He is following a generally unhealthy diet. When asked about meal planning, he reported none. He has not had a previous visit with a dietitian. He never participates in exercise. (He did not bring any logs nor meter to review today.  He is previsit A1c was 11.4%, today in the clinic it was 10.6%.) An ACE inhibitor/angiotensin II receptor blocker is not being taken. Eye exam is current.     Review of Systems  Constitutional: Negative for chills, fatigue, fever and unexpected weight change.  HENT: Negative for dental problem, mouth sores and trouble swallowing.   Eyes: Negative for visual disturbance.  Respiratory: Negative for cough, choking, chest tightness, shortness of breath and wheezing.   Cardiovascular: Negative for chest pain, palpitations and leg swelling.  Gastrointestinal: Negative for abdominal distention, abdominal pain, constipation, diarrhea, nausea and vomiting.  Endocrine: Positive for polydipsia and polyuria. Negative for polyphagia.  Genitourinary: Negative for dysuria, flank pain, hematuria and urgency.  Musculoskeletal: Negative for back pain, gait problem, myalgias and neck pain.  Skin: Negative for pallor, rash and wound.  Neurological: Negative for seizures, syncope, weakness, numbness and headaches.  Psychiatric/Behavioral: Negative for confusion and dysphoric mood.    Objective:    BP 134/83   Pulse 77   Ht 5\' 11"  (1.803 m)   Wt 168 lb (76.2 kg)   BMI 23.43 kg/m   Wt Readings from Last 3 Encounters:  09/20/18 168 lb (76.2 kg)  03/25/17 169 lb (76.7 kg)  03/23/17 169 lb (76.7 kg)     Physical Exam Constitutional:      General: He is not in acute distress.    Appearance: He is well-developed.  HENT:     Head: Normocephalic and atraumatic.  Neck:      Musculoskeletal: Normal range of motion and neck supple.     Thyroid: No thyromegaly.     Trachea: No tracheal deviation.  Cardiovascular:     Rate and Rhythm: Normal rate.     Pulses:          Dorsalis pedis pulses are 1+ on the right side and 1+ on the left side.       Posterior tibial pulses are 1+ on the right side and 1+ on the left side.     Heart sounds: S1 normal and S2 normal. No murmur. No gallop.   Pulmonary:  Effort: Pulmonary effort is normal. No respiratory distress.     Breath sounds: No wheezing.  Abdominal:     General: There is no distension.     Tenderness: There is no abdominal tenderness. There is no guarding.  Musculoskeletal:     Right shoulder: He exhibits no swelling and no deformity.  Skin:    General: Skin is warm and dry.     Findings: No rash.     Nails: There is no clubbing.   Neurological:     Mental Status: He is alert and oriented to person, place, and time.     Cranial Nerves: No cranial nerve deficit.     Sensory: No sensory deficit.     Gait: Gait normal.     Deep Tendon Reflexes: Reflexes are normal and symmetric.  Psychiatric:        Speech: Speech normal.        Behavior: Behavior normal. Behavior is cooperative.        Thought Content: Thought content normal.        Judgment: Judgment normal.     Comments: Patient has reluctant affect.       CMP ( most recent) CMP     Component Value Date/Time   NA 134 10/01/2009   K 4.0 10/01/2009   CL 98 10/01/2009   CO2 25.0 10/01/2009   GLUCOSE 528 10/01/2009   BUN 25 (A) 07/28/2018   CREATININE 0.8 07/28/2018   CREATININE 1.02 10/01/2009   CALCIUM 9.1 10/01/2009   GFRNONAA >60 10/01/2009   GFRAA >60 10/01/2009     Diabetic Labs (most recent): Lab Results  Component Value Date   HGBA1C 10.6 (A) 09/20/2018   HGBA1C 11.4 07/28/2018   HGBA1C 12.3 10/01/2009     Lipid Panel ( most recent) Lipid Panel     Component Value Date/Time   CHOL 76 07/28/2018   TRIG 91 07/28/2018    HDL 52 07/28/2018   LDLCALC 6 07/28/2018     Assessment & Plan:   1. Uncontrolled type 2 diabetes mellitus with hyperglycemia (HCC) - Frederick Alexander has currently uncontrolled symptomatic type 2 DM since  48 years of age,  with most recent A1c of 10.6 %. Recent labs reviewed.  He is previous measurements are A1c of 11.4%, 12.3%, 11.6%. -He is accompanied by his sister to clinic.   - I had a long discussion with him about the progressive nature of diabetes and the pathology behind its complications. -He denies any gross complications from his diabetes, however, he remains at a high risk for more acute and chronic complications which include CAD, CVA, CKD, retinopathy, and neuropathy. These are all discussed in detail with him.  - I have counseled him on diet  and weight management  by adopting a carbohydrate restricted/protein rich diet. Patient is encouraged to switch to  unprocessed or minimally processed     complex starch and increased protein intake (animal or plant source), fruits, and vegetables. -  he is advised to stick to a routine mealtimes to eat 3 meals  a day and avoid unnecessary snacks ( to snack only to correct hypoglycemia).   - he admits that there is a room for improvement in his food and drink choices. - Suggestion is made for him to avoid simple carbohydrates  from his diet including Cakes, Sweet Desserts, Ice Cream, Soda (diet and regular), Sweet Tea, Candies, Chips, Cookies, Store Bought Juices, Alcohol in Excess of  1-2 drinks a day, Artificial Sweeteners,  Coffee Creamer, and "Sugar-free" Products. This will help patient to have more stable blood glucose profile and potentially avoid unintended weight gain.  - he will be scheduled with Norm SaltPenny Crumpton, RDN, CDE for diabetes education.  - I have approached him with the following individualized plan to manage  his diabetes and patient agrees:   -Based on his current and prevailing glycemic burden, will likely require  intensive treatment with basal/bolus insulin in order for him to achieve control of diabetes to target. -However, patient seems to be overwhelmed, and needs to engage properly for monitoring for safe use of insulin. -In preparation, I advised him to reduce his Lantus to 20 units nightly associated with  strict monitoring of glucose 4 times a day-before meals and at bedtime. - he is warned not to take insulin without proper monitoring per orders.  - he is encouraged to call clinic for blood glucose levels less than 70 or above 300 mg /dl. - he is advised to lower his metformin to 500 mg XR p.o. twice daily, , therapeutically suitable for patient . He is not a suitable candidate for incretin therapy, nor for SGLT2 inhibitor therapy. - Specific targets for  A1c;  LDL, HDL, Triglycerides, and  Waist Circumference were discussed with the patient.  2) Blood Pressure :  his blood pressure is controlled to target.   he is not on any antihypertensive medications.     3) Lipids:   Review of his recent lipid panel showed  controlled  LDL at 6 .  he  is not on any statins, will not be considered for statin therapy at this time.     4)  Weight/Diet:  Body mass index is 23.43 kg/m.  -    he is not a candidate for weight loss.  He  will still benefit from a consult with a dietitian/CDE.  Exercise, and detailed carbohydrates information provided  -  detailed on discharge instructions.  5) Chronic Care/Health Maintenance:  -he  Is not on ACEI/ARB and Statin medications and  is encouraged to initiate and continue to follow up with Ophthalmology, Dentist,  Podiatrist at least yearly or according to recommendations, and advised to  stay away from smoking. I have recommended yearly flu vaccine and pneumonia vaccine at least every 5 years; moderate intensity exercise for up to 150 minutes weekly; and  sleep for at least 7 hours a day.  - he is  advised to maintain close follow up with Royann ShiversSkillman, Katherine E, PA-C  for primary care needs, as well as his other providers for optimal and coordinated care.  - Time spent with the patient: 45 minutes, of which >50% was spent in obtaining information about his symptoms, reviewing his previous labs/studies, evaluations, and treatments, counseling him about his currently uncontrolled type 2 diabetes,, and developing plans for long term treatment based on the latest standards of care/guidelines.  Please refer to " Patient Self Inventory" in the Media  tab for reviewed elements of pertinent patient history.  Frederick Alexander participated in the discussions, expressed understanding, and voiced agreement with the above plans.  All questions were answered to his satisfaction. he is encouraged to contact clinic should he have any questions or concerns prior to his return visit.  Follow up plan: - Return in about 10 days (around 09/30/2018) for Follow up with Meter and Logs Only - no Labs.  Marquis LunchGebre Cathaleen Korol, MD [facility] [address] [place], [place] [address] Phone: [phone]  Fax:: [phone]    09/20/2018, 5:27 PM  This note was partially dictated with voice recognition software. Similar sounding words can be transcribed inadequately or may not  be corrected upon review.

## 2018-09-20 NOTE — Patient Instructions (Signed)

## 2018-10-03 ENCOUNTER — Ambulatory Visit: Payer: Commercial Managed Care - PPO | Admitting: "Endocrinology

## 2018-10-03 ENCOUNTER — Other Ambulatory Visit: Payer: Self-pay

## 2018-11-24 ENCOUNTER — Ambulatory Visit (INDEPENDENT_AMBULATORY_CARE_PROVIDER_SITE_OTHER): Payer: Commercial Managed Care - PPO | Admitting: Urology

## 2018-11-24 DIAGNOSIS — N529 Male erectile dysfunction, unspecified: Secondary | ICD-10-CM

## 2019-02-22 NOTE — Progress Notes (Deleted)
Subjective:  No diagnosis found.   CC: Perianall nodule.   Hx: Frederick Alexander is a 48 WM who presents with a 3 month history of an enlarging nodule in the perineal area. He saw his PCP who got an [place] that showed a 6 mm hypoechoic nodule in the area of concern. He can feel it when he is lying on his side but has no pain with activity. He has mild LUTS with some frequency and nocturia when his glucose is up. 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 He has no associated signs or symptoms. He can have some SP discomfort in the morning when he voids. he has had no UTI's or GU surgery.      ROS:  ROS:  A complete review of systems was performed.  All systems are negative except for pertinent findings as noted.   ROS  Allergies  Allergen Reactions  . Prednisone Other (See Comments)    Uratic behavior     Outpatient Encounter Medications as of 02/23/2019  Medication Sig  . ASPIRIN 81 PO Take 1 tablet by mouth daily.  . Insulin Glargine (LANTUS SOLOSTAR) 100 UNIT/ML Solostar Pen Inject 20 Units into the skin at bedtime.  . metFORMIN (GLUCOPHAGE-XR) 500 MG 24 hr tablet Take 1 tablet (500 mg total) by mouth 2 (two) times daily after a meal.  . naproxen sodium (ALEVE) 220 MG tablet Take 220 mg by mouth daily.    No facility-administered encounter medications on file as of 02/23/2019.    Past Medical History:  Diagnosis Date  . Diabetes mellitus (HCC)    type 2  . Hyperlipidemia     Past Surgical History:  Procedure Laterality Date  . CHOLECYSTECTOMY  10/04/2016   Dr. Gabriel Cirri  . RIGHT/LEFT HEART CATH AND CORONARY ANGIOGRAPHY N/A 03/25/2017   Procedure: RIGHT/LEFT HEART CATH AND CORONARY ANGIOGRAPHY;  Surgeon: Corky Crafts, MD;  Location: [facility] INVASIVE CV LAB;  Service: Cardiovascular;  Laterality: N/A;    Social History   Socioeconomic History  . Marital status: Married    Spouse name: Not on file  . Number of children: Not on file  . Years  of education: Not on file  . Highest education level: Not on file  Occupational History  . Not on file  Tobacco Use  . Smoking status: Never Smoker  . Smokeless tobacco: Never Used  Substance and Sexual Activity  . Alcohol use: Never  . Drug use: Never  . Sexual activity: Not on file  Other Topics Concern  . Not on file  Social History Narrative  . Not on file   Social Determinants of Health   Financial Resource Strain:   . Difficulty of Paying Living Expenses: Not on file  Food Insecurity:   . Worried About Programme researcher, broadcasting/film/video in the Last Year: Not on file  . Ran Out of Food in the Last Year: Not on file  Transportation Needs:   . Lack of Transportation (Medical): Not on file  . Lack of Transportation (Non-Medical): Not on file  Physical Activity:   . Days of Exercise per Week: Not on file  . Minutes of Exercise per Session: Not on file  Stress:   . Feeling of Stress : Not on file  Social Connections:   . Frequency of Communication with Friends and Family: Not on file  . Frequency of Social Gatherings with Friends and Family: Not on file  . Attends Religious Services:  Not on file  . Active Member of Clubs or Organizations: Not on file  . Attends Archivist Meetings: Not on file  . Marital Status: Not on file  Intimate Partner Violence:   . Fear of Current or Ex-Partner: Not on file  . Emotionally Abused: Not on file  . Physically Abused: Not on file  . Sexually Abused: Not on file    Family History  Problem Relation Age of Onset  . Diabetes Maternal Grandmother        Objective: There were no vitals filed for this visit.   Physical Exam  Lab Results:  No results found for this or any previous visit (from the past 24 hour(s)).  BMET No results for input(s): NA, K, CL, CO2, GLUCOSE, BUN, CREATININE, CALCIUM in the last 72 hours. PSA No results found for: PSA No results found for: TESTOSTERONE    Studies/Results: No results  found.    Assessment & Plan: No problem-specific Assessment & Plan notes found for this encounter.    No orders of the defined types were placed in this encounter.    No orders of the defined types were placed in this encounter.     No follow-ups on file.   CC: [name] 02/22/2019

## 2019-02-23 ENCOUNTER — Ambulatory Visit: Payer: Commercial Managed Care - PPO | Admitting: Urology

## 2019-02-27 ENCOUNTER — Encounter: Payer: Self-pay | Admitting: Internal Medicine

## 2019-03-13 ENCOUNTER — Ambulatory Visit: Payer: Commercial Managed Care - PPO | Admitting: Nurse Practitioner

## 2019-03-13 NOTE — Progress Notes (Deleted)
Primary Care Physician:  Sheela Stack Primary Gastroenterologist:  Dr.   Bonnetta Barry chief complaint on file.   HPI:   Frederick Alexander is a 49 y.o. male who presents on referral from primary care for possible gastroparesis.  Reviewed information provided with referral including ***.  No history of colonoscopy or endoscopy in our system.  Today he states   Past Medical History:  Diagnosis Date  . Diabetes mellitus (HCC)    type 2  . Hyperlipidemia     Past Surgical History:  Procedure Laterality Date  . CHOLECYSTECTOMY  10/04/2016   Dr. Gabriel Cirri  . RIGHT/LEFT HEART CATH AND CORONARY ANGIOGRAPHY N/A 03/25/2017   Procedure: RIGHT/LEFT HEART CATH AND CORONARY ANGIOGRAPHY;  Surgeon: Corky Crafts, MD;  Location: [facility] INVASIVE CV LAB;  Service: Cardiovascular;  Laterality: N/A;    Current Outpatient Medications  Medication Sig Dispense Refill  . ASPIRIN 81 PO Take 1 tablet by mouth daily.    . Insulin Glargine (LANTUS SOLOSTAR) 100 UNIT/ML Solostar Pen Inject 20 Units into the skin at bedtime.    . metFORMIN (GLUCOPHAGE-XR) 500 MG 24 hr tablet Take 1 tablet (500 mg total) by mouth 2 (two) times daily after a meal.    . naproxen sodium (ALEVE) 220 MG tablet Take 220 mg by mouth daily.      No current facility-administered medications for this visit.    Allergies as of 03/13/2019 - Review Complete 09/20/2018  Allergen Reaction Noted  . Prednisone Other (See Comments) 05/27/2010    Family History  Problem Relation Age of Onset  . Diabetes Maternal Grandmother     Social History   Socioeconomic History  . Marital status: Married    Spouse name: Not on file  . Number of children: Not on file  . Years of education: Not on file  . Highest education level: Not on file  Occupational History  . Not on file  Tobacco Use  . Smoking status: Never Smoker  . Smokeless tobacco: Never Used  Substance and Sexual Activity  . Alcohol use: Never  . Drug use: Never  .  Sexual activity: Not on file  Other Topics Concern  . Not on file  Social History Narrative  . Not on file   Social Determinants of Health   Financial Resource Strain:   . Difficulty of Paying Living Expenses: Not on file  Food Insecurity:   . Worried About Programme researcher, broadcasting/film/video in the Last Year: Not on file  . Ran Out of Food in the Last Year: Not on file  Transportation Needs:   . Lack of Transportation (Medical): Not on file  . Lack of Transportation (Non-Medical): Not on file  Physical Activity:   . Days of Exercise per Week: Not on file  . Minutes of Exercise per Session: Not on file  Stress:   . Feeling of Stress : Not on file  Social Connections:   . Frequency of Communication with Friends and Family: Not on file  . Frequency of Social Gatherings with Friends and Family: Not on file  . Attends Religious Services: Not on file  . Active Member of Clubs or Organizations: Not on file  . Attends Banker Meetings: Not on file  . Marital Status: Not on file  Intimate Partner Violence:   . Fear of Current or Ex-Partner: Not on file  . Emotionally Abused: Not on file  . Physically Abused: Not on file  . Sexually Abused: Not  on file    Review of Systems: General: Negative for anorexia, weight loss, fever, chills, fatigue, weakness. Eyes: Negative for vision changes.  ENT: Negative for hoarseness, difficulty swallowing , nasal congestion. CV: Negative for chest pain, angina, palpitations, dyspnea on exertion, peripheral edema.  Respiratory: Negative for dyspnea at rest, dyspnea on exertion, cough, sputum, wheezing.  GI: See history of present illness. GU:  Negative for dysuria, hematuria, urinary incontinence, urinary frequency, nocturnal urination.  MS: Negative for joint pain, low back pain.  Derm: Negative for rash or itching.  Neuro: Negative for weakness, abnormal sensation, seizure, frequent headaches, memory loss, confusion.  Psych: Negative for anxiety,  depression, suicidal ideation, hallucinations.  Endo: Negative for unusual weight change.  Heme: Negative for bruising or bleeding. Allergy: Negative for rash or hives.    Physical Exam: There were no vitals taken for this visit. General:   Alert and oriented. Pleasant and cooperative. Well-nourished and well-developed.  Head:  Normocephalic and atraumatic. Eyes:  Without icterus, sclera clear and conjunctiva pink.  Ears:  Normal auditory acuity. Mouth:  No deformity or lesions, oral mucosa pink.  Throat/Neck:  Supple, without mass or thyromegaly. Cardiovascular:  S1, S2 present without murmurs appreciated. Normal pulses noted. Extremities without clubbing or edema. Respiratory:  Clear to auscultation bilaterally. No wheezes, rales, or rhonchi. No distress.  Gastrointestinal:  +BS, soft, non-tender and non-distended. No HSM noted. No guarding or rebound. No masses appreciated.  Rectal:  Deferred  Musculoskalatal:  Symmetrical without gross deformities. Normal posture. Skin:  Intact without significant lesions or rashes. Neurologic:  Alert and oriented x4;  grossly normal neurologically. Psych:  Alert and cooperative. Normal mood and affect. Heme/Lymph/Immune: No significant cervical adenopathy. No excessive bruising noted.    03/13/2019 7:24 AM   Disclaimer: This note was dictated with voice recognition software. Similar sounding words can inadvertently be transcribed and may not be corrected upon review.

## 2019-03-27 ENCOUNTER — Ambulatory Visit: Payer: Commercial Managed Care - PPO | Admitting: Nurse Practitioner

## 2019-03-27 NOTE — Progress Notes (Deleted)
Primary Care Physician:  Rosine Door Primary Gastroenterologist:  Dr.   Rayne Du chief complaint on file.   HPI:   Frederick Alexander is a 49 y.o. male who presents on referral from primary care 02/27/2019 for possible gastroparesis. Reviewed information provided with referral including ***. Noted history of uncontrolled type 2 diabetes with most recent hemoglobin A1c 10.6 on 09/20/2018. 8 months prior his A1c was 11.4.  No previous colonoscopy or endoscopy in our system.  Today he states   Past Medical History:  Diagnosis Date  . Diabetes mellitus ([place])    type 2  . Hyperlipidemia     Past Surgical History:  Procedure Laterality Date  . CHOLECYSTECTOMY  10/04/2016   Dr. Anthony Sar  . RIGHT/LEFT HEART CATH AND CORONARY ANGIOGRAPHY N/A 03/25/2017   Procedure: RIGHT/LEFT HEART CATH AND CORONARY ANGIOGRAPHY;  Surgeon: Jettie Booze, MD;  Location: [facility];  Service: Cardiovascular;  Laterality: N/A;    Current Outpatient Medications  Medication Sig Dispense Refill  . ASPIRIN 81 PO Take 1 tablet by mouth daily.    . Insulin Glargine (LANTUS SOLOSTAR) 100 UNIT/ML Solostar Pen Inject 20 Units into the skin at bedtime.    . metFORMIN (GLUCOPHAGE-XR) 500 MG 24 hr tablet Take 1 tablet (500 mg total) by mouth 2 (two) times daily after a meal.    . naproxen sodium (ALEVE) 220 MG tablet Take 220 mg by mouth daily.      No current facility-administered medications for this visit.    Allergies as of 03/27/2019 - Review Complete 09/20/2018  Allergen Reaction Noted  . Prednisone Other (See Comments) 05/27/2010    Family History  Problem Relation Age of Onset  . Diabetes Maternal Grandmother     Social History   Socioeconomic History  . Marital status: Married    Spouse name: Not on file  . Number of children: Not on file  . Years of education: Not on file  . Highest education level: Not on file  Occupational History  . Not on file  Tobacco Use  .  Smoking status: Never Smoker  . Smokeless tobacco: Never Used  Substance and Sexual Activity  . Alcohol use: Never  . Drug use: Never  . Sexual activity: Not on file  Other Topics Concern  . Not on file  Social History Narrative  . Not on file   Social Determinants of Health   Financial Resource Strain:   . Difficulty of Paying Living Expenses:   Food Insecurity:   . Worried About Charity fundraiser in the Last Year:   . Arboriculturist in the Last Year:   Transportation Needs:   . Film/video editor (Medical):   Marland Kitchen Lack of Transportation (Non-Medical):   Physical Activity:   . Days of Exercise per Week:   . Minutes of Exercise per Session:   Stress:   . Feeling of Stress :   Social Connections:   . Frequency of Communication with Friends and Family:   . Frequency of Social Gatherings with Friends and Family:   . Attends Religious Services:   . Active Member of Clubs or Organizations:   . Attends Archivist Meetings:   Marland Kitchen Marital Status:   Intimate Partner Violence:   . Fear of Current or Ex-Partner:   . Emotionally Abused:   Marland Kitchen Physically Abused:   . Sexually Abused:     Review of Systems: General: Negative for anorexia, weight loss, fever,  chills, fatigue, weakness. Eyes: Negative for vision changes.  ENT: Negative for hoarseness, difficulty swallowing , nasal congestion. CV: Negative for chest pain, angina, palpitations, dyspnea on exertion, peripheral edema.  Respiratory: Negative for dyspnea at rest, dyspnea on exertion, cough, sputum, wheezing.  GI: See history of present illness. GU:  Negative for dysuria, hematuria, urinary incontinence, urinary frequency, nocturnal urination.  MS: Negative for joint pain, low back pain.  Derm: Negative for rash or itching.  Neuro: Negative for weakness, abnormal sensation, seizure, frequent headaches, memory loss, confusion.  Psych: Negative for anxiety, depression, suicidal ideation, hallucinations.  Endo:  Negative for unusual weight change.  Heme: Negative for bruising or bleeding. Allergy: Negative for rash or hives.    Physical Exam: There were no vitals taken for this visit. General:   Alert and oriented. Pleasant and cooperative. Well-nourished and well-developed.  Head:  Normocephalic and atraumatic. Eyes:  Without icterus, sclera clear and conjunctiva pink.  Ears:  Normal auditory acuity. Mouth:  No deformity or lesions, oral mucosa pink.  Throat/Neck:  Supple, without mass or thyromegaly. Cardiovascular:  S1, S2 present without murmurs appreciated. Normal pulses noted. Extremities without clubbing or edema. Respiratory:  Clear to auscultation bilaterally. No wheezes, rales, or rhonchi. No distress.  Gastrointestinal:  +BS, soft, non-tender and non-distended. No HSM noted. No guarding or rebound. No masses appreciated.  Rectal:  Deferred  Musculoskalatal:  Symmetrical without gross deformities. Normal posture. Skin:  Intact without significant lesions or rashes. Neurologic:  Alert and oriented x4;  grossly normal neurologically. Psych:  Alert and cooperative. Normal mood and affect. Heme/Lymph/Immune: No significant cervical adenopathy. No excessive bruising noted.    03/27/2019 7:29 AM   Disclaimer: This note was dictated with voice recognition software. Similar sounding words can inadvertently be transcribed and may not be corrected upon review.

## 2021-03-05 ENCOUNTER — Telehealth: Payer: Self-pay | Admitting: "Endocrinology

## 2021-03-05 NOTE — Telephone Encounter (Signed)
Patient did not respond to referral. Called and left a VM for patient on 2/1 and 2/20. Closed referral in proficient  ?

## 2021-07-21 ENCOUNTER — Encounter: Payer: Self-pay | Admitting: Internal Medicine

## 2021-08-15 NOTE — Progress Notes (Deleted)
Referring Provider: Caryl Bis, MD Primary Care Physician:  Caryl Bis, MD Primary Gastroenterologist:  Dr. Gala Romney  No chief complaint on file.   HPI:   Frederick Alexander is a 51 y.o. male presenting today at the request of Caryl Bis, MD for GERD.  Today:    Last EGD:  Last Colonoscopy:    Patient also has history of elevated liver enzymes: 02/04/2021: AST 72, ALT 116, alk phos 296.  A1c elevated at 13.3.  Triglycerides elevated at 435.  Platelets within normal limits. Alk phos elevation dates back to 2014. ALT became elevated in 2019. AST elevated in Feb. 2022.  Hep C antibody nonreactive 06/17/2020.  RUQ ultrasound with normal-appearing liver in February 2022.     Past Medical History:  Diagnosis Date   Diabetes mellitus ([place])    type 2   Hyperlipidemia     Past Surgical History:  Procedure Laterality Date   CHOLECYSTECTOMY  10/04/2016   Dr. Anthony Sar   RIGHT/LEFT HEART CATH AND CORONARY ANGIOGRAPHY N/A 03/25/2017   Procedure: RIGHT/LEFT HEART CATH AND CORONARY ANGIOGRAPHY;  Surgeon: Jettie Booze, MD;  Location: [facility];  Service: Cardiovascular;  Laterality: N/A;    Current Outpatient Medications  Medication Sig Dispense Refill   ASPIRIN 81 PO Take 1 tablet by mouth daily.     Insulin Glargine (LANTUS SOLOSTAR) 100 UNIT/ML Solostar Pen Inject 20 Units into the skin at bedtime.     metFORMIN (GLUCOPHAGE-XR) 500 MG 24 hr tablet Take 1 tablet (500 mg total) by mouth 2 (two) times daily after a meal.     naproxen sodium (ALEVE) 220 MG tablet Take 220 mg by mouth daily.      No current facility-administered medications for this visit.    Allergies as of 08/17/2021 - Review Complete 09/20/2018  Allergen Reaction Noted   Prednisone Other (See Comments) 05/27/2010    Family History  Problem Relation Age of Onset   Diabetes Maternal Grandmother     Social History   Socioeconomic History   Marital status: Married    Spouse name:  Not on file   Number of children: Not on file   Years of education: Not on file   Highest education level: Not on file  Occupational History   Not on file  Tobacco Use   Smoking status: Never   Smokeless tobacco: Never  Vaping Use   Vaping Use: Never used  Substance and Sexual Activity   Alcohol use: Never   Drug use: Never   Sexual activity: Not on file  Other Topics Concern   Not on file  Social History Narrative   Not on file   Social Determinants of Health   Financial Resource Strain: Not on file  Food Insecurity: Not on file  Transportation Needs: Not on file  Physical Activity: Not on file  Stress: Not on file  Social Connections: Not on file  Intimate Partner Violence: Not on file    Review of Systems: Gen: Denies any fever, chills, cold or flulike symptoms, presyncope, syncope. CV: Denies chest pain, heart palpitations. Resp: Denies shortness of breath, cough.  GI: See HPI GU : Denies urinary burning, urinary frequency, urinary hesitancy MS: Denies joint pain. Derm: Denies rash. Psych: Denies depression, anxiety. Heme: See HPI  Physical Exam: There were no vitals taken for this visit. General:   Alert and oriented. Pleasant and cooperative. Well-nourished and well-developed.  Head:  Normocephalic and atraumatic. Eyes:  Without icterus, sclera clear  and conjunctiva pink.  Ears:  Normal auditory acuity. Lungs:  Clear to auscultation bilaterally. No wheezes, rales, or rhonchi. No distress.  Heart:  S1, S2 present without murmurs appreciated.  Abdomen:  +BS, soft, non-tender and non-distended. No HSM noted. No guarding or rebound. No masses appreciated.  Rectal:  Deferred  Msk:  Symmetrical without gross deformities. Normal posture. Extremities:  Without edema. Neurologic:  Alert and  oriented x4;  grossly normal neurologically. Skin:  Intact without significant lesions or rashes. Psych: Normal mood and affect.    Assessment:     Plan:   ***   Aliene Altes, PA-C [facility] 08/17/2021

## 2021-08-17 ENCOUNTER — Ambulatory Visit: Payer: Commercial Managed Care - PPO | Admitting: Gastroenterology

## 2021-09-01 NOTE — Progress Notes (Unsigned)
Referring Provider:  Caryl Bis, MD Primary Care Physician:  Caryl Bis, MD Primary Gastroenterologist:  Dr. Gala Romney  No chief complaint on file.   HPI:   Frederick Alexander is a 51 y.o. male presenting today at the request of  Caryl Bis, MD for GERD.  Today:       Last EGD:  Last Colonoscopy:      Patient also has history of elevated liver enzymes: 02/04/2021: AST 72, ALT 116, alk phos 296.  A1c elevated at 13.3.  Triglycerides elevated at 435.  Platelets within normal limits. Alk phos elevation dates back to 2014. ALT became elevated in 2019. AST elevated in Feb. 2022.  Hep C antibody nonreactive 06/17/2020.   RUQ ultrasound with normal-appearing liver in February 2022.   Past Medical History:  Diagnosis Date   Diabetes mellitus ([place])    type 2   Hyperlipidemia     Past Surgical History:  Procedure Laterality Date   CHOLECYSTECTOMY  10/04/2016   Dr. Anthony Sar   RIGHT/LEFT HEART CATH AND CORONARY ANGIOGRAPHY N/A 03/25/2017   Procedure: RIGHT/LEFT HEART CATH AND CORONARY ANGIOGRAPHY;  Surgeon: Jettie Booze, MD;  Location: [facility];  Service: Cardiovascular;  Laterality: N/A;    Current Outpatient Medications  Medication Sig Dispense Refill   ASPIRIN 81 PO Take 1 tablet by mouth daily.     Insulin Glargine (LANTUS SOLOSTAR) 100 UNIT/ML Solostar Pen Inject 20 Units into the skin at bedtime.     metFORMIN (GLUCOPHAGE-XR) 500 MG 24 hr tablet Take 1 tablet (500 mg total) by mouth 2 (two) times daily after a meal.     naproxen sodium (ALEVE) 220 MG tablet Take 220 mg by mouth daily.      No current facility-administered medications for this visit.    Allergies as of 09/03/2021 - Review Complete 09/20/2018  Allergen Reaction Noted   Prednisone Other (See Comments) 05/27/2010    Family History  Problem Relation Age of Onset   Diabetes Maternal Grandmother     Social History   Socioeconomic History   Marital status: Married    Spouse  name: Not on file   Number of children: Not on file   Years of education: Not on file   Highest education level: Not on file  Occupational History   Not on file  Tobacco Use   Smoking status: Never   Smokeless tobacco: Never  Vaping Use   Vaping Use: Never used  Substance and Sexual Activity   Alcohol use: Never   Drug use: Never   Sexual activity: Not on file  Other Topics Concern   Not on file  Social History Narrative   Not on file   Social Determinants of Health   Financial Resource Strain: Not on file  Food Insecurity: Not on file  Transportation Needs: Not on file  Physical Activity: Not on file  Stress: Not on file  Social Connections: Not on file  Intimate Partner Violence: Not on file    Review of Systems: Gen: Denies any fever, chills, cold or flulike symptoms, presyncope, syncope. CV: Denies chest pain, heart palpitations. Resp: Denies shortness of breath, cough. GI: See HPI GU : Denies urinary burning, urinary frequency, urinary hesitancy MS: Denies joint pain. Derm: Denies rash. Psych: Denies depression, anxiety. Heme: See HPI  Physical Exam: There were no vitals taken for this visit. General:   Alert and oriented. Pleasant and cooperative. Well-nourished and well-developed.  Head:  Normocephalic and atraumatic. Eyes:  Without icterus, sclera clear and conjunctiva pink.  Ears:  Normal auditory acuity. Lungs:  Clear to auscultation bilaterally. No wheezes, rales, or rhonchi. No distress.  Heart:  S1, S2 present without murmurs appreciated.  Abdomen:  +BS, soft, non-tender and non-distended. No HSM noted. No guarding or rebound. No masses appreciated.  Rectal:  Deferred  Msk:  Symmetrical without gross deformities. Normal posture. Extremities:  Without edema. Neurologic:  Alert and  oriented x4;  grossly normal neurologically. Skin:  Intact without significant lesions or rashes. Psych:  Normal mood and affect.    Assessment:     Plan:   ***   Aliene Altes, PA-C [facility] 09/03/2021

## 2021-09-03 ENCOUNTER — Encounter: Payer: Self-pay | Admitting: *Deleted

## 2021-09-03 ENCOUNTER — Ambulatory Visit (INDEPENDENT_AMBULATORY_CARE_PROVIDER_SITE_OTHER): Payer: Commercial Managed Care - PPO | Admitting: Gastroenterology

## 2021-09-03 ENCOUNTER — Telehealth: Payer: Self-pay | Admitting: *Deleted

## 2021-09-03 ENCOUNTER — Encounter: Payer: Self-pay | Admitting: Gastroenterology

## 2021-09-03 VITALS — BP 166/90 | HR 81 | Temp 98.0°F | Ht 71.0 in | Wt 170.0 lb

## 2021-09-03 DIAGNOSIS — R7989 Other specified abnormal findings of blood chemistry: Secondary | ICD-10-CM

## 2021-09-03 DIAGNOSIS — R09A2 Foreign body sensation, throat: Secondary | ICD-10-CM

## 2021-09-03 DIAGNOSIS — R1310 Dysphagia, unspecified: Secondary | ICD-10-CM | POA: Diagnosis not present

## 2021-09-03 DIAGNOSIS — R0989 Other specified symptoms and signs involving the circulatory and respiratory systems: Secondary | ICD-10-CM

## 2021-09-03 DIAGNOSIS — R197 Diarrhea, unspecified: Secondary | ICD-10-CM | POA: Diagnosis not present

## 2021-09-03 DIAGNOSIS — J392 Other diseases of pharynx: Secondary | ICD-10-CM

## 2021-09-03 NOTE — Telephone Encounter (Signed)
LM with pre-op appt details

## 2021-09-03 NOTE — Patient Instructions (Signed)
Have labs and stool studies completed at [facility].   Continue taking omeprazole 40 mg daily for now.  Swallowing precautions:  Eat slowly, take small bites, chew thoroughly, drink plenty of liquids throughout meals.  Avoid trough textures All meats should be chopped finely.  If something gets hung in your esophagus and will not come up or go down, proceed to the emergency room.    We will arrange for you to have an upper endoscopy with possible stretching of your esophagus in the near future at [facility] with Dr. Marletta Lor. 1 day prior to procedure: Take one half dose of Lantus at bedtime. Day of procedure: Do not take any morning diabetes medications. As we discussed, it are important that you take your diabetes medications as prescribed by your primary care provider and avoid high carb foods and sugary items.  If your blood sugar is over 300 the day of your procedure, your procedure will be canceled.  We will plan to follow-up with you in the office after your procedure.  I will have further recommendations for you regarding your elevated liver enzymes and diarrhea after you complete labs and stool studies.  Ermalinda Memos, PA-C [facility]

## 2021-09-14 ENCOUNTER — Ambulatory Visit (HOSPITAL_COMMUNITY): Payer: Commercial Managed Care - PPO

## 2021-09-15 NOTE — Patient Instructions (Signed)
Frederick Alexander  09/15/2021     @PREFPERIOPPHARMACY @   Your procedure is scheduled on  09/22/2021.   Report to [facility] at  0900  A.M.   Call this number if you have problems the morning of surgery:  [phone]   Remember:  Follow the diet instructions given to you by the office.      Take 1/2 of your usual night time insulin dose the night before your procedure.     Take these medicines the morning of surgery with A SIP OF WATER                                                        prilosec.     Do not wear jewelry, make-up or nail polish.  Do not wear lotions, powders, or perfumes, or deodorant.  Do not shave 48 hours prior to surgery.  Men may shave face and neck.  Do not bring valuables to the hospital.  [facility] is not responsible for any belongings or valuables.  Contacts, dentures or bridgework may not be worn into surgery.  Leave your suitcase in the car.  After surgery it may be brought to your room.  For patients admitted to the hospital, discharge time will be determined by your treatment team.  Patients discharged the day of surgery will not be allowed to drive home and must have someone with them for 24 hours.    Special instructions:   DO NOT smoke tobacco or vape for 24 hours before your procedure.  Please read over the following fact sheets that you were given. Anesthesia Post-op Instructions and Care and Recovery After Surgery      Upper Endoscopy, Adult, Care After After the procedure, it is common to have a sore throat. It is also common to have: Mild stomach pain or discomfort. Bloating. Nausea. Follow these instructions at home: The instructions below may help you care for yourself at home. Your health care provider may give you more instructions. If you have questions, ask your health care provider. If you were given a sedative during the procedure, it can affect you for several hours. Do not drive or operate machinery until  your health care provider says that it is safe. If you will be going home right after the procedure, plan to have a responsible adult: Take you home from the hospital or clinic. You will not be allowed to drive. Care for you for the time you are told. Follow instructions from your health care provider about what you may eat and drink. Return to your normal activities as told by your health care provider. Ask your health care provider what activities are safe for you. Take over-the-counter and prescription medicines only as told by your health care provider. Contact a health care provider if you: Have a sore throat that lasts longer than one day. Have trouble swallowing. Have a fever. Get help right away if you: Vomit blood or your vomit looks like coffee grounds. Have bloody, black, or tarry stools. Have a very bad sore throat or you cannot swallow. Have difficulty breathing or very bad pain in your chest or abdomen. These symptoms may be an emergency. Get help right away. Call 911. Do not wait to see if the symptoms will go  away. Do not drive yourself to the hospital. Summary After the procedure, it is common to have a sore throat, mild stomach discomfort, bloating, and nausea. If you were given a sedative during the procedure, it can affect you for several hours. Do not drive until your health care provider says that it is safe. Follow instructions from your health care provider about what you may eat and drink. Return to your normal activities as told by your health care provider. This information is not intended to replace advice given to you by your health care provider. Make sure you discuss any questions you have with your health care provider. Document Revised: 04/01/2021 Document Reviewed: 04/01/2021 Elsevier Patient Education  2023 [employer] Inc[employer] Esophageal Dilatation Esophageal dilatation, also called esophageal dilation, is a procedure to widen or open a blocked or narrowed  part of the esophagus. The esophagus is the part of the body that moves food and liquid from the mouth to the stomach. You may need this procedure if: You have a buildup of scar tissue in your esophagus that makes it difficult, painful, or impossible to swallow. This can be caused by gastroesophageal reflux disease (GERD). You have cancer of the esophagus. There is a problem with how food moves through your esophagus. In some cases, you may need this procedure repeated at a later time to dilate the esophagus gradually. Tell a health care provider about: Any allergies you have. All medicines you are taking, including vitamins, herbs, eye drops, creams, and over-the-counter medicines. Any problems you or family members have had with anesthetic medicines. Any blood disorders you have. Any surgeries you have had. Any medical conditions you have. Any antibiotic medicines you are required to take before dental procedures. Whether you are pregnant or may be pregnant. What are the risks? Generally, this is a safe procedure. However, problems may occur, including: Bleeding due to a tear in the lining of the esophagus. A hole, or perforation, in the esophagus. What happens before the procedure? Ask your health care provider about: Changing or stopping your regular medicines. This is especially important if you are taking diabetes medicines or blood thinners. Taking medicines such as aspirin and ibuprofen. These medicines can thin your blood. Do not take these medicines unless your health care provider tells you to take them. Taking over-the-counter medicines, vitamins, herbs, and supplements. Follow instructions from your health care provider about eating or drinking restrictions. Plan to have a responsible adult take you home from the hospital or clinic. Plan to have a responsible adult care for you for the time you are told after you leave the hospital or clinic. This is important. What happens  during the procedure? You may be given a medicine to help you relax (sedative). A numbing medicine may be sprayed into the back of your throat, or you may gargle the medicine. Your health care provider may perform the dilatation using various surgical instruments, such as: Simple dilators. This instrument is carefully placed in the esophagus to stretch it. Guided wire bougies. This involves using an endoscope to insert a wire into the esophagus. A dilator is passed over this wire to enlarge the esophagus. Then the wire is removed. Balloon dilators. An endoscope with a small balloon is inserted into the esophagus. The balloon is inflated to stretch the esophagus and open it up. The procedure may vary among health care providers and hospitals. What can I expect after the procedure? Your blood pressure, heart rate, breathing rate, and blood oxygen level will  be monitored until you leave the hospital or clinic. Your throat may feel slightly sore and numb. This will get better over time. You will not be allowed to eat or drink until your throat is no longer numb. When you are able to drink, urinate, and sit on the edge of the bed without nausea or dizziness, you may be able to return home. Follow these instructions at home: Take over-the-counter and prescription medicines only as told by your health care provider. If you were given a sedative during the procedure, it can affect you for several hours. Do not drive or operate machinery until your health care provider says that it is safe. Plan to have a responsible adult care for you for the time you are told. This is important. Follow instructions from your health care provider about any eating or drinking restrictions. Do not use any products that contain nicotine or tobacco, such as cigarettes, e-cigarettes, and chewing tobacco. If you need help quitting, ask your health care provider. Keep all follow-up visits. This is important. Contact a health  care provider if: You have a fever. You have pain that is not relieved by medicine. Get help right away if: You have chest pain. You have trouble breathing. You have trouble swallowing. You vomit blood. You have black, tarry, or bloody stools. These symptoms may represent a serious problem that is an emergency. Do not wait to see if the symptoms will go away. Get medical help right away. Call your local emergency services (911 in the [place] Do not drive yourself to the hospital. Summary Esophageal dilatation, also called esophageal dilation, is a procedure to widen or open a blocked or narrowed part of the esophagus. Plan to have a responsible adult take you home from the hospital or clinic. For this procedure, a numbing medicine may be sprayed into the back of your throat, or you may gargle the medicine. Do not drive or operate machinery until your health care provider says that it is safe. This information is not intended to replace advice given to you by your health care provider. Make sure you discuss any questions you have with your health care provider. Document Revised: 05/09/2019 Document Reviewed: 05/09/2019 Elsevier Patient Education  2023 Elsevier Inc.  Monitored Anesthesia Care, Care After This sheet gives you information about how to care for yourself after your procedure. Your health care provider may also give you more specific instructions. If you have problems or questions, contact your health care provider. What can I expect after the procedure? After the procedure, it is common to have: Tiredness. Forgetfulness about what happened after the procedure. Impaired judgment for important decisions. Nausea or vomiting. Some difficulty with balance. Follow these instructions at home: For the time period you were told by your health care provider:     Rest as needed. Do not participate in activities where you could fall or become injured. Do not drive or use  machinery. Do not drink alcohol. Do not take sleeping pills or medicines that cause drowsiness. Do not make important decisions or sign legal documents. Do not take care of children on your own. Eating and drinking Follow the diet that is recommended by your health care provider. Drink enough fluid to keep your urine pale yellow. If you vomit: Drink water, juice, or soup when you can drink without vomiting. Make sure you have little or no nausea before eating solid foods. General instructions Have a responsible adult stay with you for the time you are  told. It is important to have someone help care for you until you are awake and alert. Take over-the-counter and prescription medicines only as told by your health care provider. If you have sleep apnea, surgery and certain medicines can increase your risk for breathing problems. Follow instructions from your health care provider about wearing your sleep device: Anytime you are sleeping, including during daytime naps. While taking prescription pain medicines, sleeping medicines, or medicines that make you drowsy. Avoid smoking. Keep all follow-up visits as told by your health care provider. This is important. Contact a health care provider if: You keep feeling nauseous or you keep vomiting. You feel light-headed. You are still sleepy or having trouble with balance after 24 hours. You develop a rash. You have a fever. You have redness or swelling around the IV site. Get help right away if: You have trouble breathing. You have new-onset confusion at home. Summary For several hours after your procedure, you may feel tired. You may also be forgetful and have poor judgment. Have a responsible adult stay with you for the time you are told. It is important to have someone help care for you until you are awake and alert. Rest as told. Do not drive or operate machinery. Do not drink alcohol or take sleeping pills. Get help right away if you have  trouble breathing, or if you suddenly become confused. This information is not intended to replace advice given to you by your health care provider. Make sure you discuss any questions you have with your health care provider. Document Revised: 11/25/2020 Document Reviewed: 11/23/2018 Elsevier Patient Education  2023 [employer].

## 2021-09-17 ENCOUNTER — Encounter (HOSPITAL_COMMUNITY): Payer: Self-pay

## 2021-09-17 ENCOUNTER — Telehealth: Payer: Self-pay | Admitting: *Deleted

## 2021-09-17 ENCOUNTER — Encounter (HOSPITAL_COMMUNITY)
Admission: RE | Admit: 2021-09-17 | Discharge: 2021-09-17 | Disposition: A | Discharge status: Home / Self Care | Payer: Commercial Managed Care - PPO | Source: Ambulatory Visit | Attending: Internal Medicine | Admitting: Internal Medicine

## 2021-09-17 DIAGNOSIS — E119 Type 2 diabetes mellitus without complications: Secondary | ICD-10-CM

## 2021-09-17 NOTE — Telephone Encounter (Signed)
-----   Message from Lillia Mountain, RN sent at 09/17/2021  9:15 AM EDT ----- Regarding: No show for PAT Mr Hislop was a no show for PAT  Thanks,  Cliffton Asters RN

## 2021-09-17 NOTE — Telephone Encounter (Signed)
LMOVM to call back 

## 2021-09-21 ENCOUNTER — Encounter: Payer: Self-pay | Admitting: *Deleted

## 2021-09-21 ENCOUNTER — Telehealth: Payer: Self-pay | Admitting: Internal Medicine

## 2021-09-21 NOTE — Telephone Encounter (Signed)
Pt LMOM that he was sick and needed to reschedule procedure for tomorrow with Dr Abbey Chatters. [phone]

## 2021-09-21 NOTE — Telephone Encounter (Signed)
See prior message Tammy rescheduled patient.

## 2021-09-21 NOTE — Telephone Encounter (Signed)
Noted and rescheduled pt

## 2021-09-22 ENCOUNTER — Encounter (HOSPITAL_COMMUNITY): Admission: RE | Payer: Self-pay | Source: Home / Self Care

## 2021-09-22 ENCOUNTER — Ambulatory Visit (HOSPITAL_COMMUNITY): Admission: RE | Admit: 2021-09-22 | Payer: Commercial Managed Care - PPO | Source: Home / Self Care

## 2021-09-22 SURGERY — ESOPHAGOGASTRODUODENOSCOPY (EGD) WITH PROPOFOL
Anesthesia: Monitor Anesthesia Care

## 2021-09-28 ENCOUNTER — Ambulatory Visit (HOSPITAL_COMMUNITY): Admission: RE | Admit: 2021-09-28 | Payer: Commercial Managed Care - PPO | Source: Ambulatory Visit

## 2021-10-09 NOTE — Patient Instructions (Addendum)
Frederick Alexander  10/09/2021     @PREFPERIOPPHARMACY @   Your procedure is scheduled on  10/16/2021.   Report to 10/18/2021 at  0730  A.M.   Call this number if you have problems the morning of surgery:  [phone]   Remember:  Follow the diet instructions given to you by the office.       Take 12.5 units of insulin the night before your procedure.         DO NOT take any medications for diabetes the morning of your procedure.     Take these medicines the morning of surgery with A SIP OF WATER                                         prilosec.     Do not wear jewelry, make-up or nail polish.  Do not wear lotions, powders, or perfumes, or deodorant.  Do not shave 48 hours prior to surgery.  Men may shave face and neck.  Do not bring valuables to the hospital.  [facility] is not responsible for any belongings or valuables.  Contacts, dentures or bridgework may not be worn into surgery.  Leave your suitcase in the car.  After surgery it may be brought to your room.  For patients admitted to the hospital, discharge time will be determined by your treatment team.  Patients discharged the day of surgery will not be allowed to drive home and must have someone with them for 24 hours.    Special instructions:   DO NOT smoke tobacco or vape for 24 hours before your procedure.  Please read over the following fact sheets that you were given. Anesthesia Post-op Instructions and Care and Recovery After Surgery       Upper Endoscopy, Adult, Care After After the procedure, it is common to have a sore throat. It is also common to have: Mild stomach pain or discomfort. Bloating. Nausea. Follow these instructions at home: The instructions below may help you care for yourself at home. Your health care provider may give you more instructions. If you have questions, ask your health care provider. If you were given a sedative during the procedure, it can affect you for  several hours. Do not drive or operate machinery until your health care provider says that it is safe. If you will be going home right after the procedure, plan to have a responsible adult: Take you home from the hospital or clinic. You will not be allowed to drive. Care for you for the time you are told. Follow instructions from your health care provider about what you may eat and drink. Return to your normal activities as told by your health care provider. Ask your health care provider what activities are safe for you. Take over-the-counter and prescription medicines only as told by your health care provider. Contact a health care provider if you: Have a sore throat that lasts longer than one day. Have trouble swallowing. Have a fever. Get help right away if you: Vomit blood or your vomit looks like coffee grounds. Have bloody, black, or tarry stools. Have a very bad sore throat or you cannot swallow. Have difficulty breathing or very bad pain in your chest or abdomen. These symptoms may be an emergency. Get help right away. Call 911. Do not wait to see if the symptoms  will go away. Do not drive yourself to the hospital. Summary After the procedure, it is common to have a sore throat, mild stomach discomfort, bloating, and nausea. If you were given a sedative during the procedure, it can affect you for several hours. Do not drive until your health care provider says that it is safe. Follow instructions from your health care provider about what you may eat and drink. Return to your normal activities as told by your health care provider. This information is not intended to replace advice given to you by your health care provider. Make sure you discuss any questions you have with your health care provider. Document Revised: 04/01/2021 Document Reviewed: 04/01/2021 Elsevier Patient Education  2023 Elsevier Inc. Monitored Anesthesia Care, Care After This sheet gives you information about  how to care for yourself after your procedure. Your health care provider may also give you more specific instructions. If you have problems or questions, contact your health care provider. What can I expect after the procedure? After the procedure, it is common to have: Tiredness. Forgetfulness about what happened after the procedure. Impaired judgment for important decisions. Nausea or vomiting. Some difficulty with balance. Follow these instructions at home: For the time period you were told by your health care provider:     Rest as needed. Do not participate in activities where you could fall or become injured. Do not drive or use machinery. Do not drink alcohol. Do not take sleeping pills or medicines that cause drowsiness. Do not make important decisions or sign legal documents. Do not take care of children on your own. Eating and drinking Follow the diet that is recommended by your health care provider. Drink enough fluid to keep your urine pale yellow. If you vomit: Drink water, juice, or soup when you can drink without vomiting. Make sure you have little or no nausea before eating solid foods. General instructions Have a responsible adult stay with you for the time you are told. It is important to have someone help care for you until you are awake and alert. Take over-the-counter and prescription medicines only as told by your health care provider. If you have sleep apnea, surgery and certain medicines can increase your risk for breathing problems. Follow instructions from your health care provider about wearing your sleep device: Anytime you are sleeping, including during daytime naps. While taking prescription pain medicines, sleeping medicines, or medicines that make you drowsy. Avoid smoking. Keep all follow-up visits as told by your health care provider. This is important. Contact a health care provider if: You keep feeling nauseous or you keep vomiting. You feel  light-headed. You are still sleepy or having trouble with balance after 24 hours. You develop a rash. You have a fever. You have redness or swelling around the IV site. Get help right away if: You have trouble breathing. You have new-onset confusion at home. Summary For several hours after your procedure, you may feel tired. You may also be forgetful and have poor judgment. Have a responsible adult stay with you for the time you are told. It is important to have someone help care for you until you are awake and alert. Rest as told. Do not drive or operate machinery. Do not drink alcohol or take sleeping pills. Get help right away if you have trouble breathing, or if you suddenly become confused. This information is not intended to replace advice given to you by your health care provider. Make sure you discuss any questions you  have with your health care provider. Document Revised: 11/25/2020 Document Reviewed: 11/23/2018 Elsevier Patient Education  [place].

## 2021-10-13 ENCOUNTER — Encounter (HOSPITAL_COMMUNITY)
Admission: RE | Admit: 2021-10-13 | Discharge: 2021-10-13 | Disposition: A | Discharge status: Home / Self Care | Payer: Commercial Managed Care - PPO | Source: Ambulatory Visit | Attending: Internal Medicine | Admitting: Internal Medicine

## 2021-10-13 ENCOUNTER — Other Ambulatory Visit: Payer: Self-pay

## 2021-10-13 VITALS — BP 169/88 | HR 86 | Temp 97.7°F | Resp 18 | Ht 71.0 in | Wt 170.0 lb

## 2021-10-13 DIAGNOSIS — E0800 Diabetes mellitus due to underlying condition with hyperosmolarity without nonketotic hyperglycemic-hyperosmolar coma (NKHHC): Secondary | ICD-10-CM | POA: Diagnosis not present

## 2021-10-13 DIAGNOSIS — Z01812 Encounter for preprocedural laboratory examination: Secondary | ICD-10-CM | POA: Diagnosis present

## 2021-10-13 LAB — BASIC METABOLIC PANEL
Anion gap: 6 (ref 5–15)
BUN: 22 mg/dL — ABNORMAL HIGH (ref 6–20)
CO2: 27 mmol/L (ref 22–32)
Calcium: 8.8 mg/dL — ABNORMAL LOW (ref 8.9–10.3)
Chloride: 104 mmol/L (ref 98–111)
Creatinine, Ser: 0.77 mg/dL (ref 0.61–1.24)
GFR, Estimated: >60 mL/min (ref >60)
Glucose, Bld: 240 mg/dL — ABNORMAL HIGH (ref 70–99)
Potassium: 4.2 mmol/L (ref 3.5–5.1)
Sodium: 137 mmol/L (ref 135–145)

## 2021-10-16 ENCOUNTER — Encounter (HOSPITAL_COMMUNITY)
Admission: RE | Disposition: A | Discharge status: Home / Self Care | Payer: Self-pay | Source: Home / Self Care | Attending: Internal Medicine

## 2021-10-16 ENCOUNTER — Telehealth: Payer: Self-pay | Admitting: Internal Medicine

## 2021-10-16 ENCOUNTER — Ambulatory Visit (HOSPITAL_COMMUNITY)
Admission: RE | Admit: 2021-10-16 | Discharge: 2021-10-16 | Disposition: A | Discharge status: Home / Self Care | Payer: Commercial Managed Care - PPO | Attending: Internal Medicine | Admitting: Internal Medicine

## 2021-10-16 ENCOUNTER — Ambulatory Visit (HOSPITAL_COMMUNITY): Payer: Commercial Managed Care - PPO | Admitting: Anesthesiology

## 2021-10-16 ENCOUNTER — Encounter (HOSPITAL_COMMUNITY): Payer: Self-pay

## 2021-10-16 ENCOUNTER — Ambulatory Visit (HOSPITAL_BASED_OUTPATIENT_CLINIC_OR_DEPARTMENT_OTHER): Payer: Commercial Managed Care - PPO | Admitting: Anesthesiology

## 2021-10-16 DIAGNOSIS — K2970 Gastritis, unspecified, without bleeding: Secondary | ICD-10-CM

## 2021-10-16 DIAGNOSIS — K3189 Other diseases of stomach and duodenum: Secondary | ICD-10-CM

## 2021-10-16 DIAGNOSIS — E1165 Type 2 diabetes mellitus with hyperglycemia: Secondary | ICD-10-CM | POA: Diagnosis not present

## 2021-10-16 DIAGNOSIS — R09A2 Foreign body sensation, throat: Secondary | ICD-10-CM

## 2021-10-16 DIAGNOSIS — K219 Gastro-esophageal reflux disease without esophagitis: Secondary | ICD-10-CM | POA: Insufficient documentation

## 2021-10-16 DIAGNOSIS — Z794 Long term (current) use of insulin: Secondary | ICD-10-CM | POA: Diagnosis not present

## 2021-10-16 DIAGNOSIS — Z7984 Long term (current) use of oral hypoglycemic drugs: Secondary | ICD-10-CM | POA: Insufficient documentation

## 2021-10-16 DIAGNOSIS — R1310 Dysphagia, unspecified: Secondary | ICD-10-CM | POA: Insufficient documentation

## 2021-10-16 DIAGNOSIS — R053 Chronic cough: Secondary | ICD-10-CM | POA: Insufficient documentation

## 2021-10-16 DIAGNOSIS — K2950 Unspecified chronic gastritis without bleeding: Secondary | ICD-10-CM | POA: Diagnosis not present

## 2021-10-16 DIAGNOSIS — K31A19 Gastric intestinal metaplasia without dysplasia, unspecified site: Secondary | ICD-10-CM | POA: Insufficient documentation

## 2021-10-16 HISTORY — PX: ESOPHAGOGASTRODUODENOSCOPY (EGD) WITH PROPOFOL: SHX5813

## 2021-10-16 HISTORY — PX: BIOPSY: SHX5522

## 2021-10-16 LAB — GLUCOSE, CAPILLARY
Glucose-Capillary: 213 mg/dL — ABNORMAL HIGH (ref 70–99)
Glucose-Capillary: 264 mg/dL — ABNORMAL HIGH (ref 70–99)

## 2021-10-16 SURGERY — ESOPHAGOGASTRODUODENOSCOPY (EGD) WITH PROPOFOL
Anesthesia: General

## 2021-10-16 MED ORDER — LIDOCAINE HCL (CARDIAC) PF 100 MG/5ML IV SOSY
PREFILLED_SYRINGE | INTRAVENOUS | Status: DC | PRN
  Administered 2021-10-16: 50 mg via INTRAVENOUS

## 2021-10-16 MED ORDER — PANTOPRAZOLE SODIUM 40 MG PO TBEC: 40.0000 mg | DELAYED_RELEASE_TABLET | Freq: Two times a day (BID) | ORAL | 11 refills | Status: DC

## 2021-10-16 MED ORDER — LACTATED RINGERS IV SOLN
INTRAVENOUS | Status: DC
  Administered 2021-10-16: 1000 mL via INTRAVENOUS

## 2021-10-16 MED ORDER — PROPOFOL 10 MG/ML IV BOLUS
INTRAVENOUS | Status: DC | PRN
  Administered 2021-10-16: 90 mg via INTRAVENOUS
  Administered 2021-10-16: 20 mg via INTRAVENOUS

## 2021-10-16 NOTE — Op Note (Signed)
[facility] Patient Name: Frederick Alexander Procedure Date: 10/16/2021 9:18 AM MRN: [id number] Date of Birth: 04/04/1970 Attending MD: Elon Alas. Abbey Chatters DO CSN: [id number] Age: 51 Admit Type: Outpatient Procedure:                Upper GI endoscopy Indications:              Dysphagia, Chronic cough Providers:                Elon Alas. Abbey Chatters, DO, Lambert Mody, Casimer Bilis, Technician, Randa Spike,                            [employer] officer Referring MD:              Medicines:                See the Anesthesia note for documentation of the                            administered medications Complications:            No immediate complications. Estimated Blood Loss:     Estimated blood loss was minimal. Procedure:                Pre-Anesthesia Assessment:                           - The anesthesia plan was to use monitored                            anesthesia care (MAC).                           After obtaining informed consent, the endoscope was                            passed under direct vision. Throughout the                            procedure, the patient's blood pressure, pulse, and                            oxygen saturations were monitored continuously. The                            [id number]-H190 ([id number]) scope was introduced through the                            mouth, and advanced to the second part of duodenum.                            The upper GI endoscopy was accomplished without                            difficulty. The patient tolerated the procedure  well. Scope In: 9:31:48 AM Scope Out: 9:35:08 AM Total Procedure Duration: 0 hours 3 minutes 20 seconds  Findings:      The Z-line was regular and was found 39 cm from the incisors.      There is no endoscopic evidence of bleeding, areas of erosion,       esophagitis, stenosis, stricture, ulcerations or varices in the entire       esophagus.       Diffuse moderate inflammation characterized by erosions and erythema was       found in the entire examined stomach. Biopsies were taken with a cold       forceps for Helicobacter pylori testing.      Diffuse mucosal variance characterized by white specks was found in the       duodenal bulb, in the first portion of the duodenum and in the second       portion of the duodenum. Biopsies were taken with a cold forceps for       histology. Impression:               - Z-line regular, 39 cm from the incisors.                           - Gastritis. Biopsied.                           - Mucosal variant in the duodenum. Biopsied. Moderate Sedation:      Per Anesthesia Care Recommendation:           - Patient has a contact number available for                            emergencies. The signs and symptoms of potential                            delayed complications were discussed with the                            patient. Return to normal activities tomorrow.                            Written discharge instructions were provided to the                            patient.                           - Resume previous diet.                           - Continue present medications.                           - Await pathology results.                           - Use Protonix (pantoprazole) 40 mg PO BID.                           -  Needs better control of diabetes                           - ENT referral                           - Consider CT soft tissue neck                           - Esophagus patent without strictures or esophagitis Procedure Code(s):        --- Professional ---                           [phone], Esophagogastroduodenoscopy, flexible,                            transoral; with biopsy, single or multiple Diagnosis Code(s):        --- Professional ---                           K29.70, Gastritis, unspecified, without bleeding                           K31.89, Other diseases of  stomach and duodenum                           R13.10, Dysphagia, unspecified                           R05, Cough CPT copyright 2019 American Medical Association. All rights reserved. The codes documented in this report are preliminary and upon coder review may  be revised to meet current compliance requirements. Elon Alas. Abbey Chatters, DO [place] Abbey Chatters, DO 10/16/2021 9:38:50 AM This report has been signed electronically. Number of Addenda: 0

## 2021-10-16 NOTE — Anesthesia Postprocedure Evaluation (Signed)
Anesthesia Post Note  Patient: Eduardo Osier  Procedure(s) Performed: ESOPHAGOGASTRODUODENOSCOPY (EGD) WITH PROPOFOL BIOPSY  Patient location during evaluation: Phase II Anesthesia Type: General Level of consciousness: awake and alert and oriented Pain management: pain level controlled Vital Signs Assessment: post-procedure vital signs reviewed and stable Respiratory status: spontaneous breathing, nonlabored ventilation and respiratory function stable Cardiovascular status: blood pressure returned to baseline and stable Postop Assessment: no apparent nausea or vomiting Anesthetic complications: no   No notable events documented.   Last Vitals:  Vitals:   10/16/21 0839 10/16/21 0943  BP: (!) 185/95 (!) 168/98  Pulse: 96 (!) 101  Resp: 13 14  Temp: 37.2 C 36.5 C  SpO2: 99% 98%    Last Pain:  Vitals:   10/16/21 0947  TempSrc:   PainSc: 0-No pain                 Andriy Sherk C Shirl Weir

## 2021-10-16 NOTE — H&P (Signed)
Primary Care Physician:  Caryl Bis, MD Primary Gastroenterologist:  Dr. Abbey Chatters  Pre-Procedure History & Physical: HPI:  Frederick Alexander is a 51 y.o. male is here for an EGD with possible dilation to be performed for dysphagia, chronic cough  Past Medical History:  Diagnosis Date   Diabetes mellitus ([place])    type 2   Hyperlipidemia     Past Surgical History:  Procedure Laterality Date   CHOLECYSTECTOMY  10/04/2016   Dr. Anthony Sar   RIGHT/LEFT HEART CATH AND CORONARY ANGIOGRAPHY N/A 03/25/2017   Procedure: RIGHT/LEFT HEART CATH AND CORONARY ANGIOGRAPHY;  Surgeon: Jettie Booze, MD;  Location: [facility];  Service: Cardiovascular;  Laterality: N/A;    Prior to Admission medications   Medication Sig Start Date End Date Taking? Authorizing Provider  guaiFENesin (MUCUS RELIEF) 600 MG 12 hr tablet Take by mouth 2 (two) times daily.   Yes [provider]  Insulin Glargine (LANTUS SOLOSTAR) 100 UNIT/ML Solostar Pen Inject 40 Units into the skin at bedtime. 01/08/17  Yes [provider]  metFORMIN (GLUCOPHAGE-XR) 500 MG 24 hr tablet Take 1 tablet (500 mg total) by mouth 2 (two) times daily after a meal. 09/20/18  Yes Nida, Marella Chimes, MD  naproxen sodium (ALEVE) 220 MG tablet Take 220 mg by mouth daily.    Yes [provider]  omeprazole (PRILOSEC) 40 MG capsule Take 40 mg by mouth daily. 08/21/21  Yes [provider]  ASPIRIN 81 PO Take 1 tablet by mouth daily.    [provider]    Allergies as of 09/21/2021 - Review Complete 09/03/2021  Allergen Reaction Noted   Prednisone Other (See Comments) 05/27/2010    Family History  Problem Relation Age of Onset   Diabetes Maternal Grandmother    Colon cancer Neg Hx    Gastric cancer Neg Hx    Esophageal cancer Neg Hx    Inflammatory bowel disease Neg Hx    Liver disease Neg Hx    Autoimmune disease Neg Hx     Social History   Socioeconomic History   Marital status:  Married    Spouse name: Not on file   Number of children: Not on file   Years of education: Not on file   Highest education level: Not on file  Occupational History   Not on file  Tobacco Use   Smoking status: Never   Smokeless tobacco: Never  Vaping Use   Vaping Use: Never used  Substance and Sexual Activity   Alcohol use: Never   Drug use: Never   Sexual activity: Not on file  Other Topics Concern   Not on file  Social History Narrative   Not on file   Social Determinants of Health   Financial Resource Strain: Not on file  Food Insecurity: Not on file  Transportation Needs: Not on file  Physical Activity: Not on file  Stress: Not on file  Social Connections: Not on file  Intimate Partner Violence: Not on file    Review of Systems: General: Negative for fever, chills, fatigue, weakness. Eyes: Negative for vision changes.  ENT: Negative for hoarseness, difficulty swallowing , nasal congestion. CV: Negative for chest pain, angina, palpitations, dyspnea on exertion, peripheral edema.  Respiratory: Negative for dyspnea at rest, dyspnea on exertion, cough, sputum, wheezing.  GI: See history of present illness. GU:  Negative for dysuria, hematuria, urinary incontinence, urinary frequency, nocturnal urination.  MS: Negative for joint pain, low back pain.  Derm: Negative  for rash or itching.  Neuro: Negative for weakness, abnormal sensation, seizure, frequent headaches, memory loss, confusion.  Psych: Negative for anxiety, depression Endo: Negative for unusual weight change.  Heme: Negative for bruising or bleeding. Allergy: Negative for rash or hives.  Physical Exam: Vital signs in last 24 hours: Temp:  [98.9 F (37.2 C)] 98.9 F (37.2 C) (10/13 0839) Pulse Rate:  [96] 96 (10/13 0839) Resp:  [13] 13 (10/13 0839) BP: (185)/(95) 185/95 (10/13 0839) SpO2:  [99 %] 99 % (10/13 0839) Weight:  [77.1 kg] 77.1 kg (10/13 0839)   General:   Alert,  Well-developed,  well-nourished, pleasant and cooperative in NAD Head:  Normocephalic and atraumatic. Eyes:  Sclera clear, no icterus.   Conjunctiva pink. Ears:  Normal auditory acuity. Nose:  No deformity, discharge,  or lesions. Mouth:  No deformity or lesions, dentition normal. Neck:  Supple; no masses or thyromegaly. Lungs:  Clear throughout to auscultation.   No wheezes, crackles, or rhonchi. No acute distress. Heart:  Regular rate and rhythm; no murmurs, clicks, rubs,  or gallops. Abdomen:  Soft, nontender and nondistended. No masses, hepatosplenomegaly or hernias noted. Normal bowel sounds, without guarding, and without rebound.   Msk:  Symmetrical without gross deformities. Normal posture. Extremities:  Without clubbing or edema. Neurologic:  Alert and  oriented x4;  grossly normal neurologically. Skin:  Intact without significant lesions or rashes. Cervical Nodes:  No significant cervical adenopathy. Psych:  Alert and cooperative. Normal mood and affect.   Impression/Plan: [name] is here for an EGD with possible dilation to be performed for dysphagia, chronic cough  Risks, benefits, limitations, imponderables and alternatives regarding EGD have been reviewed with the patient. Questions have been answered. All parties agreeable.

## 2021-10-16 NOTE — Telephone Encounter (Signed)
Noted  

## 2021-10-16 NOTE — Telephone Encounter (Signed)
Referral faxed to [facility] in [place].

## 2021-10-16 NOTE — Transfer of Care (Signed)
Immediate Anesthesia Transfer of Care Note  Patient: Frederick Alexander  Procedure(s) Performed: ESOPHAGOGASTRODUODENOSCOPY (EGD) WITH PROPOFOL BIOPSY  Patient Location: Short Stay  Anesthesia Type:General  Level of Consciousness: awake, alert  and oriented  Airway & Oxygen Therapy: Patient Spontanous Breathing  Post-op Assessment: Report given to RN and Post -op Vital signs reviewed and stable  Post vital signs: Reviewed and stable  Last Vitals:  Vitals Value Taken Time  BP 168/98 10/16/21 0943  Temp 36.5 C 10/16/21 0943  Pulse 101 10/16/21 0943  Resp 14 10/16/21 0943  SpO2 98 % 10/16/21 0943    Last Pain:  Vitals:   10/16/21 0943  TempSrc: Oral  PainSc: 0-No pain      Patients Stated Pain Goal: 7 (11/24/60 4469)  Complications: No notable events documented.

## 2021-10-16 NOTE — Telephone Encounter (Signed)
Just finished EGD on this patient.  Esophagus widely patent.  No strictures.  Did have some gastritis which I biopsied.  Duodenum looked slightly abnormal also took samples of this as well.  Going to change his omeprazole to pantoprazole 40 mg twice daily.  Do not see anything on today's exam to correlate with patient's chronic globus sensation.  Would recommend referral to ENT.  Can we place this Mindy?  [employer] as an [place]

## 2021-10-16 NOTE — Anesthesia Preprocedure Evaluation (Signed)
Anesthesia Evaluation  Patient identified by MRN, date of birth, ID band Patient awake    Reviewed: Allergy & Precautions, NPO status , Patient's Chart, lab work & pertinent test results  Airway Mallampati: II  TM Distance: >3 FB Neck ROM: Full    Dental  (+) Dental Advisory Given, Teeth Intact   Pulmonary neg pulmonary ROS,    Pulmonary exam normal breath sounds clear to auscultation       Cardiovascular negative cardio ROS Normal cardiovascular exam Rhythm:Regular Rate:Normal     Neuro/Psych negative neurological ROS  negative psych ROS   GI/Hepatic Neg liver ROS, GERD  Medicated,  Endo/Other  diabetes, Poorly Controlled, Type 2, Oral Hypoglycemic Agents, Insulin Dependent  Renal/GU negative Renal ROS  negative genitourinary   Musculoskeletal negative musculoskeletal ROS (+)   Abdominal   Peds negative pediatric ROS (+)  Hematology negative hematology ROS (+)   Anesthesia Other Findings   Reproductive/Obstetrics negative OB ROS                             Anesthesia Physical Anesthesia Plan  ASA: 3  Anesthesia Plan: General   Post-op Pain Management: Minimal or no pain anticipated   Induction: Intravenous  PONV Risk Score and Plan: Propofol infusion  Airway Management Planned: Nasal Cannula and Natural Airway  Additional Equipment:   Intra-op Plan:   Post-operative Plan:   Informed Consent: I have reviewed the patients History and Physical, chart, labs and discussed the procedure including the risks, benefits and alternatives for the proposed anesthesia with the patient or authorized representative who has indicated his/her understanding and acceptance.     Dental advisory given  Plan Discussed with: CRNA and Surgeon  Anesthesia Plan Comments:         Anesthesia Quick Evaluation

## 2021-10-16 NOTE — Discharge Instructions (Addendum)
EGD Discharge instructions Please read the instructions outlined below and refer to this sheet in the next few weeks. These discharge instructions provide you with general information on caring for yourself after you leave the hospital. Your doctor may also give you specific instructions. While your treatment has been planned according to the most current medical practices available, unavoidable complications occasionally occur. If you have any problems or questions after discharge, please call your doctor. ACTIVITY You may resume your regular activity but move at a slower pace for the next 24 hours.  Take frequent rest periods for the next 24 hours.  Walking will help expel (get rid of) the air and reduce the bloated feeling in your abdomen.  No driving for 24 hours (because of the anesthesia (medicine) used during the test).  You may shower.  Do not sign any important legal documents or operate any machinery for 24 hours (because of the anesthesia used during the test).  NUTRITION Drink plenty of fluids.  You may resume your normal diet.  Begin with a light meal and progress to your normal diet.  Avoid alcoholic beverages for 24 hours or as instructed by your caregiver.  MEDICATIONS You may resume your normal medications unless your caregiver tells you otherwise.  WHAT YOU CAN EXPECT TODAY You may experience abdominal discomfort such as a feeling of fullness or "gas" pains.  FOLLOW-UP Your doctor will discuss the results of your test with you.  SEEK IMMEDIATE MEDICAL ATTENTION IF ANY OF THE FOLLOWING OCCUR: Excessive nausea (feeling sick to your stomach) and/or vomiting.  Severe abdominal pain and distention (swelling).  Trouble swallowing.  Temperature over 101 F (37.8 C).  Rectal bleeding or vomiting of blood.   Your EGD revealed mild amount inflammation in your stomach.  I took biopsies of this to rule out infection with a bacteria called H. pylori.  Await pathology results, my  office will contact you.  I also took biopsies of your small bowel.  Your esophagus was widely open.  No strictures or tightenings.  I am unsure what is causing your symptoms.  We will refer you to ENT for further evaluation.  For the inflammation in your stomach, I am going to start you on a new medication called pantoprazole 40 mg twice daily.  Stop your omeprazole.  I sent this to your pharmacy.   I hope you have a great rest of your week!  Elon Alas. Abbey Chatters, D.O. Gastroenterology and Hepatology [facility]]

## 2021-10-16 NOTE — Addendum Note (Signed)
Addended by: Cheron Every on: 10/16/2021 10:54 AM   Modules accepted: Orders

## 2021-10-20 LAB — SURGICAL PATHOLOGY

## 2021-10-21 ENCOUNTER — Telehealth: Payer: Self-pay | Admitting: Internal Medicine

## 2021-10-21 ENCOUNTER — Encounter (HOSPITAL_COMMUNITY): Payer: Self-pay | Admitting: Internal Medicine

## 2021-10-21 MED ORDER — BISMUTH 262 MG PO CHEW: 2.0000 | CHEWABLE_TABLET | Freq: Four times a day (QID) | ORAL | 0 refills | Status: AC

## 2021-10-21 MED ORDER — METRONIDAZOLE 500 MG PO TABS: 500.0000 mg | ORAL_TABLET | Freq: Three times a day (TID) | ORAL | 0 refills | Status: AC

## 2021-10-21 MED ORDER — TETRACYCLINE HCL 500 MG PO CAPS: 500.0000 mg | ORAL_CAPSULE | Freq: Four times a day (QID) | ORAL | 0 refills | Status: AC

## 2021-10-21 NOTE — Telephone Encounter (Signed)
Phoned and advised the pt of his result note and medications sent I by the Dr. Instructions given as well for medications and follow up appt needed to be scheduled. Pt expressed understanding

## 2021-10-21 NOTE — Telephone Encounter (Signed)
Patient has H. pylori gastritis.  I will send in 14-day course of tetracycline 500 mg 4 times daily, metronidazole 500 mg 3 times daily.   Continue pantoprazole 40 mg twice daily.   I will also send in bismuth to take 4 times daily as well.    Follow-up as previously scheduled to discuss eradication testing.  Thank you

## 2022-02-04 ENCOUNTER — Encounter: Payer: Self-pay | Admitting: Internal Medicine

## 2022-02-08 ENCOUNTER — Encounter (HOSPITAL_COMMUNITY): Payer: Self-pay | Admitting: Internal Medicine

## 2022-12-04 NOTE — Progress Notes (Signed)
Referring Provider: Richardean Chimera, MD Primary Care Physician:  Richardean Chimera, MD Primary GI Physician: Dr. Marletta Lor  Chief Complaint  Patient presents with   Diarrhea    Diarrhea all the time. Tickle in his throat.     HPI:   Frederick Alexander is a 52 y.o. male with history of hyperlipidemia, uncontrolled diabetes, presenting today at the request of Richardean Chimera, MD for elevated LFTs.  I last saw patient in the office 09/03/2021 for globus, dysphagia, diarrhea, elevated LFTs.   Reported a tickle in his throat that caused significant daily coughing since having COVID-19 in 2020.  This can be triggered by turning his head a certain way, eating.  Also with globus, intermittent solid food and liquid dysphagia.  PCP empirically started omeprazole 40 mg, but he had no improvement.  Recommended EGD.    Also with intermittent watery diarrhea with 7-8 bowel movements per day.  Suspected symptoms to be multifactorial in the setting of uncontrolled diabetes, metformin, bile salt diarrhea, but planned to check stool studies, thyroid function, celiac disease.    Also noted elevated alk phos dating back to 2014, ALT became elevated in 2019 and AST elevated and 2022.  Platelets normal.  Hep C antibody nonreactive in June 2022. RUQ ultrasound with normal-appearing liver February 2022.  He had no signs or symptoms of decompensated liver disease. Denied any history of alcohol use, OTC supplements/herbal teas, illicit drug use, Tylenol, personal or family history of autoimmune conditions, family history of liver disease. Suspected elevation secondary to fatty liver and uncontrolled diabetes, but planned to evaluate for viral hepatitis, autoimmune hepatitis, PBC, Wilson's disease, alpha 1 antitrypsin deficiency, and update ultrasound.  Ultrasound was not completed.  Labs completed at outside facility on 10/12/2021: INR 0.91, IgM 240 (H), IgA and IgG within normal limits.  AMA, ANA within normal limits.   ASMA was not completed.  Hep C antibody negative.  Hep A antibody IgM negative.  Hep B surface antigen and surface antibody negative, hep B core antibody IgM negative.  Ceruloplasmin 26.  TSH within normal limits.  Celiac screen negative.  Alpha-1 antitrypsin lab was not completed.  Stool studies were not completed.  EGD 11-09-21: Gastritis biopsied, mucosal variant in the duodenum biopsied.  Recommended PPI twice daily, better controlled diabetes, ENT referral, consider CT soft tissue neck.  Surgical pathology with H. pylori gastritis.  Recommended treatment with tetracycline, metronidazole, bismuth, pantoprazole.   Most recent labs 11/08/2022: AST 59, ALT 106, alk phos 255. Glucose 429 Creatinine 1.32 Platelets 195. Hemoglobin A1c 11.9 TSH 1.78   Today: Saw ENT and was started on an allergy pill and gabapentin, but this hasn't helped.   Still with a tickle in his throat triggered by eating or turning his head. Tried cough drops, cough medications, gargled with peroxide, but nothing helps.   Having reflux of some nasty liquid 1-2 times a day. Had been off pantoprazole a few months.  States he wasn't taking it as prescribed previously. May be taking omeprazole per PCP. Not sure dose. Just once a day. No nausea or vomiting.   Taking aleve once daily for leg pain.   Blood sugar continuing running high. Currently in the mid to upper 300s regularly. Has been higher than 600. PCP hasn't made any med changers. Craves sweets   Taking cholestyramine per PCP once a day and diarrhea is controlled, but would like a pill as he doesn't like the taste. No brbpr or melena. Needs colonoscopy.  No ETOH.     Past Medical History:  Diagnosis Date   Diabetes mellitus (HCC)    type 2   Hyperlipidemia     Past Surgical History:  Procedure Laterality Date   BIOPSY  10/16/2021   Procedure: BIOPSY;  Surgeon: Lanelle Bal, DO;  Location: [facility];  Service: Endoscopy;;   CHOLECYSTECTOMY   10/04/2016   Dr. Gabriel Cirri   ESOPHAGOGASTRODUODENOSCOPY (EGD) WITH PROPOFOL N/A 10/16/2021   Procedure: ESOPHAGOGASTRODUODENOSCOPY (EGD) WITH PROPOFOL;  Surgeon: Lanelle Bal, DO;  Location: [facility];  Service: Endoscopy;  Laterality: N/A;  9:00 am   RIGHT/LEFT HEART CATH AND CORONARY ANGIOGRAPHY N/A 03/25/2017   Procedure: RIGHT/LEFT HEART CATH AND CORONARY ANGIOGRAPHY;  Surgeon: Corky Crafts, MD;  Location: [facility];  Service: Cardiovascular;  Laterality: N/A;    Current Outpatient Medications  Medication Sig Dispense Refill   ASPIRIN 81 PO Take 1 tablet by mouth daily.     colestipol (COLESTID) 1 g tablet Take 2 tablets (2 g total) by mouth daily. 60 tablet 3   Continuous Glucose Sensor (DEXCOM G6 SENSOR) MISC Apply topically.     Continuous Glucose Transmitter (DEXCOM G6 TRANSMITTER) MISC USE AS DIRECTED FOR 90 DAYS     gabapentin (NEURONTIN) 300 MG capsule TAKE ONE CAPSULE BY MOUTH DAILY FOR 7 DAYS THE MAY increase TO 2 CAPSULES DAILY     Insulin Glargine (LANTUS SOLOSTAR) 100 UNIT/ML Solostar Pen Inject 40 Units into the skin at bedtime.     metFORMIN (GLUCOPHAGE-XR) 500 MG 24 hr tablet Take 1 tablet (500 mg total) by mouth 2 (two) times daily after a meal. (Patient taking differently: Take 1,000 mg by mouth 2 (two) times daily after a meal.)     montelukast (SINGULAIR) 10 MG tablet Take 1 tablet by mouth at bedtime.     naproxen sodium (ALEVE) 220 MG tablet Take 220 mg by mouth daily.      vitamin C (ASCORBIC ACID) 250 MG tablet Take 250 mg by mouth daily.     guaiFENesin (MUCUS RELIEF) 600 MG 12 hr tablet Take by mouth 2 (two) times daily. (Patient not taking: Reported on 12/06/2022)     No current facility-administered medications for this visit.    Allergies as of 12/06/2022 - Review Complete 12/06/2022  Allergen Reaction Noted   Prednisone Other (See Comments) 05/27/2010    Family History  Problem Relation Age of Onset   Diabetes Maternal  Grandmother    Colon cancer Neg Hx    Gastric cancer Neg Hx    Esophageal cancer Neg Hx    Inflammatory bowel disease Neg Hx    Liver disease Neg Hx    Autoimmune disease Neg Hx     Social History   Socioeconomic History   Marital status: Married    Spouse name: Not on file   Number of children: Not on file   Years of education: Not on file   Highest education level: Not on file  Occupational History   Not on file  Tobacco Use   Smoking status: Never   Smokeless tobacco: Never  Vaping Use   Vaping status: Never Used  Substance and Sexual Activity   Alcohol use: Never   Drug use: Never   Sexual activity: Not on file  Other Topics Concern   Not on file  Social History Narrative   Not on file   Social Determinants of Health   Financial Resource Strain: Not on file  Food Insecurity:  Not on file  Transportation Needs: Not on file  Physical Activity: Not on file  Stress: Not on file  Social Connections: Not on file    Review of Systems: Gen: Denies fever, chills, cold or flu like symptoms, pre-syncope, or syncope.  CV: Denies chest pain, palpitations. Resp: Denies dyspnea, cough. GI: See HPI Heme: See HPI  Physical Exam: BP 134/75 (BP Location: Left Arm, Patient Position: Sitting, Cuff Size: Normal)   Pulse 87   Temp 97.7 F (36.5 C) (Temporal)   Ht 5\' 11"  (1.803 m)   Wt 185 lb (83.9 kg)   BMI 25.80 kg/m  General:   Alert and oriented. No distress noted. Pleasant and cooperative.  Head:  Normocephalic and atraumatic. Eyes:  Conjuctiva clear without scleral icterus. Heart:  S1, S2 present without murmurs appreciated. Lungs:  Clear to auscultation bilaterally. No wheezes, rales, or rhonchi. No distress.  Abdomen:  +BS, soft, non-tender and non-distended. No rebound or guarding. No HSM or masses noted. Msk:  Symmetrical without gross deformities. Normal posture. Extremities:  Without edema. Neurologic:  Alert and  oriented x4 Psych:  Normal mood and  affect.    Assessment:  52 year old male with history of HLD, uncontrolled diabetes, presenting today for follow-up of elevated LFTs, diarrhea, dysphagia, throat irritation/cough, and H. pylori gastritis.  Throat irritation/cough: Chronic "tickle" in his throat that causes coughing, triggered by eating with symptoms occurring after his first bite, more triggered by turning his head.  No improvement with numerous over-the-counter medications for cough.  EGD in October 2023 for the same symptoms with a normal esophagus.  Reports he did see ENT who started him on gabapentin and an allergy pill, but no improvement.  States ENT recommended a swallow study.  We will get this scheduled for him.  Etiology is unclear.  He is having intermittent reflux a couple times a week on omeprazole daily which may be contributing.  Notably, we had recommended pantoprazole twice daily, but patient states he did not take this as prescribed.  GERD: Not adequately managed on omeprazole 20 mg daily.  Currently with regurgitation of "nasty liquid" 1-2 times a day.  We had previously recommended pantoprazole twice daily, but patient states he did not take this as prescribed.  We will have him hold omeprazole while we check for H. pylori eradication and plan to resume pantoprazole twice daily if H. pylori is negative.    H. pylori gastritis: Diagnosed at the time of EGD 10/16/2021.  Reports completing course of tetracycline, metronidazole, bismuth, and pantoprazole twice daily.  Will need to check for eradication.  Elevated LFTs: Chronic. Elevated alk phos dating back to 2014, ALT became elevated in 2019 and AST elevated and 2022.  RUQ ultrasound with normal-appearing liver in February 2022.  No signs or symptoms of decompensated liver disease.  Has denied any history of alcohol use, OTC supplements/herbal teas, illicit drug use, Tylenol, personal or family history of autoimmune conditions, family history of liver disease.    Extensive serologic workup has been negative for Hep B and C. Hep A IgM and Hep B core IgM completed rather than total, but both were negative. IgM 240 (H), IgA and IgG within normal limits.  AMA, ANA within normal limits.  Ceruloplasmin 26.  TSH within normal limits.  Celiac screen negative.  Alpha-1 antitrypsin and ASMA ordered but left off.  I had suspected that LFT elevation is secondary to fatty liver in the setting of severely uncontrolled diabetes, but unable to rule out other  underlying etiologies, possibly AMA negative PBC.  Most recent labs with alk phos 255, ALT 106, AST 59.  I had discussed a liver biopsy with the patient at his office visit and he was agreeable.  However, after additional chart review, I would like to go ahead and complete additional labs that were previously left off as well as updating ultrasound before completing a liver biopsy.  It still very possible that he will need a biopsy, but I would like to have all of the labs and imaging updated prior.  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  I will change his medication to Colestid 2 g daily.  Colon cancer screening: Needs first-ever screening colonoscopy.  No alarm symptoms.  Due to severely uncontrolled diabetes, we are holding off on scheduling until diabetes is under better control.   Plan:  Hold omeprazole x 14 days starting tomorrow, then complete H. pylori breath test.  If H. pylori breath test is negative, high-dose PPI twice daily see if this will help with his postprandial cough. HFP, ASMA, alpha-1 antitrypsin phenotype, Hep A Ab total, Hep B core Ab total at the time of H. pylori breath test. RUQ [place].  Consider liver biopsy pending labs and [place] result.  Counseled on fatty liver with recommendations for weight loss or diet and exercise. Discussed extensively the importance of getting diabetes  under better control. Barium pill esophagram Stop cholestyramine and start Colestid 2 g daily. Follow-up with PCP for diabetes management. Follow-up in 3 months or sooner if needed.   Ermalinda Memos, PA-C [facility] 12/06/2022

## 2022-12-06 ENCOUNTER — Encounter: Payer: Self-pay | Admitting: Gastroenterology

## 2022-12-06 ENCOUNTER — Other Ambulatory Visit: Payer: Self-pay | Admitting: *Deleted

## 2022-12-06 ENCOUNTER — Ambulatory Visit: Payer: BC Managed Care – PPO | Admitting: Gastroenterology

## 2022-12-06 VITALS — BP 134/75 | HR 87 | Temp 97.7°F | Ht 71.0 in | Wt 185.0 lb

## 2022-12-06 DIAGNOSIS — J392 Other diseases of pharynx: Secondary | ICD-10-CM

## 2022-12-06 DIAGNOSIS — K219 Gastro-esophageal reflux disease without esophagitis: Secondary | ICD-10-CM

## 2022-12-06 DIAGNOSIS — R197 Diarrhea, unspecified: Secondary | ICD-10-CM | POA: Diagnosis not present

## 2022-12-06 DIAGNOSIS — R7989 Other specified abnormal findings of blood chemistry: Secondary | ICD-10-CM

## 2022-12-06 DIAGNOSIS — K9089 Other intestinal malabsorption: Secondary | ICD-10-CM

## 2022-12-06 DIAGNOSIS — K2970 Gastritis, unspecified, without bleeding: Secondary | ICD-10-CM

## 2022-12-06 DIAGNOSIS — R059 Cough, unspecified: Secondary | ICD-10-CM | POA: Diagnosis not present

## 2022-12-06 DIAGNOSIS — Z1211 Encounter for screening for malignant neoplasm of colon: Secondary | ICD-10-CM

## 2022-12-06 DIAGNOSIS — B9681 Helicobacter pylori [H. pylori] as the cause of diseases classified elsewhere: Secondary | ICD-10-CM

## 2022-12-06 MED ORDER — COLESTIPOL HCL 1 G PO TABS: 2.0000 g | ORAL_TABLET | Freq: Every day | ORAL | 3 refills | Status: DC

## 2022-12-06 NOTE — Patient Instructions (Addendum)
Stop taking omeprazole starting tomorrow.  Hold this medication for 14 days, then go to [facility] lab to complete H. pylori breath test.  I will also like for you to have blood work as well to check a couple of markers that were left off of your prior labs and to recheck your liver enzymes.  As we discussed, it is very important that you get your diabetes under better control.  I recommend that you reach out to your primary care doctor today to request an office visit for diabetes management.  We will get you scheduled for a liver biopsy due to persistently elevated liver enzymes.  Dietary/lifestyle recommendations: Low fat/cholesterol diet.   Avoid sweets, sodas, fruit juices, sweetened beverages like tea, etc. Gradually increase exercise from 15 min daily up to 1 hr per day 5 days/week.   For throat irritation/cough: We will get you scheduled for a swallow study at [facility] as recommended by ENT.   For diarrhea: Stop taking the packets of medication and start Colestid 2 g daily.  I have sent a prescription to your pharmacy.   We will plan for routine follow-up in 3 months, but I will have additional recommendations for you after H. pylori breath test, lab results, swallow study, and liver biopsy.  Ermalinda Memos, PA-C [facility]

## 2022-12-08 ENCOUNTER — Ambulatory Visit (HOSPITAL_COMMUNITY)
Admission: RE | Admit: 2022-12-08 | Discharge: 2022-12-08 | Disposition: A | Discharge status: Home / Self Care | Payer: BC Managed Care – PPO | Source: Ambulatory Visit | Attending: Gastroenterology | Admitting: Gastroenterology

## 2022-12-08 DIAGNOSIS — J392 Other diseases of pharynx: Secondary | ICD-10-CM | POA: Insufficient documentation

## 2022-12-08 DIAGNOSIS — R059 Cough, unspecified: Secondary | ICD-10-CM | POA: Insufficient documentation

## 2022-12-10 ENCOUNTER — Telehealth: Payer: Self-pay | Admitting: Gastroenterology

## 2022-12-10 NOTE — Telephone Encounter (Signed)
I had discussed a liver biopsy with the patient at his office visit and he is currently scheduled for 12/19.  However, after additional chart review, I would like to complete additional labs that were previously left off as well as updating ultrasound before completing a liver biopsy.  It still very possible that he will need a biopsy, but I would like to have all of the labs and imaging updated prior.  Courtney/Tammy, please let the patient know the above. He needs to have blood work completed and we need to get the [place] scheduled. We likely need to reschedule/just cancel the liver biopsy for now until I have results from labs and imaging.   I have placed the orders for the labs. They were placed on different date, so we may need to print the orders so he can take everything with him to the lab to sure they are all completed. He can have these all completed now or wait until he goes for H pylori breath test.

## 2022-12-13 ENCOUNTER — Other Ambulatory Visit: Payer: Self-pay | Admitting: *Deleted

## 2022-12-13 ENCOUNTER — Encounter: Payer: Self-pay | Admitting: *Deleted

## 2022-12-13 DIAGNOSIS — R7989 Other specified abnormal findings of blood chemistry: Secondary | ICD-10-CM

## 2022-12-13 NOTE — Telephone Encounter (Signed)
LMOVM with appt date, time, location and instructions for [place]. Any questions to call back. Will send mychart message as well. Liver biopsy has been cancelled.

## 2022-12-13 NOTE — Telephone Encounter (Signed)
Spoke to pt, informed him of recommendations. Pt voiced understanding. Put lab requisitions at front desk for pt to pick up.

## 2022-12-13 NOTE — Telephone Encounter (Signed)
Noted. Tammy, can you arrange the RUQ [place] for elevated LFTs and cancel the liver biopsy for now? We can reschedule the biopsy if needed after labs and [place].

## 2022-12-15 ENCOUNTER — Ambulatory Visit (HOSPITAL_COMMUNITY)
Admission: RE | Admit: 2022-12-15 | Discharge: 2022-12-15 | Disposition: A | Discharge status: Home / Self Care | Payer: BC Managed Care – PPO | Source: Ambulatory Visit | Attending: Gastroenterology | Admitting: Gastroenterology

## 2022-12-15 DIAGNOSIS — R7989 Other specified abnormal findings of blood chemistry: Secondary | ICD-10-CM | POA: Diagnosis present

## 2022-12-20 ENCOUNTER — Other Ambulatory Visit: Payer: Self-pay | Admitting: *Deleted

## 2022-12-20 DIAGNOSIS — R059 Cough, unspecified: Secondary | ICD-10-CM

## 2022-12-23 ENCOUNTER — Telehealth (HOSPITAL_COMMUNITY): Payer: BC Managed Care – PPO

## 2022-12-23 ENCOUNTER — Ambulatory Visit (HOSPITAL_COMMUNITY)
Admission: RE | Admit: 2022-12-23 | Discharge: 2022-12-23 | Disposition: A | Discharge status: Home / Self Care | Payer: BC Managed Care – PPO | Source: Ambulatory Visit | Attending: Gastroenterology | Admitting: Gastroenterology

## 2022-12-23 ENCOUNTER — Ambulatory Visit (HOSPITAL_COMMUNITY): Payer: BC Managed Care – PPO

## 2022-12-23 DIAGNOSIS — R059 Cough, unspecified: Secondary | ICD-10-CM | POA: Diagnosis present

## 2022-12-23 LAB — POCT I-STAT CREATININE: Creatinine, Ser: 1.1 mg/dL (ref 0.61–1.24)

## 2022-12-23 MED ORDER — IOHEXOL 350 MG/ML SOLN
100.0000 mL | Freq: Once | INTRAVENOUS | Status: AC | PRN
  Administered 2022-12-23: 100 mL via INTRAVENOUS

## 2022-12-29 LAB — H. PYLORI BREATH TEST: H. pylori Breath Test: NOT DETECTED

## 2022-12-29 LAB — ANTI-SMOOTH MUSCLE ANTIBODY, IGG: Actin (Smooth Muscle) Antibody (IGG): <20 U (ref <20)

## 2022-12-29 LAB — ALPHA-1-ANTITRYPSIN: A-1 Antitrypsin, Ser: 134 mg/dL (ref 83–199)

## 2023-01-02 ENCOUNTER — Other Ambulatory Visit: Payer: Self-pay | Admitting: Gastroenterology

## 2023-01-02 DIAGNOSIS — K219 Gastro-esophageal reflux disease without esophagitis: Secondary | ICD-10-CM

## 2023-01-02 MED ORDER — PANTOPRAZOLE SODIUM 40 MG PO TBEC: 40.0000 mg | DELAYED_RELEASE_TABLET | Freq: Two times a day (BID) | ORAL | 3 refills | Status: DC

## 2023-01-19 ENCOUNTER — Other Ambulatory Visit (HOSPITAL_COMMUNITY): Payer: Self-pay | Admitting: Radiology

## 2023-01-19 DIAGNOSIS — R7989 Other specified abnormal findings of blood chemistry: Secondary | ICD-10-CM

## 2023-01-21 ENCOUNTER — Ambulatory Visit (HOSPITAL_COMMUNITY): Payer: BC Managed Care – PPO

## 2023-02-08 ENCOUNTER — Encounter: Payer: Self-pay | Admitting: *Deleted

## 2023-03-09 ENCOUNTER — Telehealth: Payer: Self-pay | Admitting: *Deleted

## 2023-03-09 ENCOUNTER — Ambulatory Visit: Admitting: Internal Medicine

## 2023-03-09 VITALS — BP 178/101 | HR 87 | Temp 98.5°F | Ht 71.0 in | Wt 182.8 lb

## 2023-03-09 DIAGNOSIS — K529 Noninfective gastroenteritis and colitis, unspecified: Secondary | ICD-10-CM

## 2023-03-09 DIAGNOSIS — K760 Fatty (change of) liver, not elsewhere classified: Secondary | ICD-10-CM

## 2023-03-09 DIAGNOSIS — K219 Gastro-esophageal reflux disease without esophagitis: Secondary | ICD-10-CM

## 2023-03-09 DIAGNOSIS — J392 Other diseases of pharynx: Secondary | ICD-10-CM

## 2023-03-09 DIAGNOSIS — Z1211 Encounter for screening for malignant neoplasm of colon: Secondary | ICD-10-CM

## 2023-03-09 DIAGNOSIS — R059 Cough, unspecified: Secondary | ICD-10-CM

## 2023-03-09 DIAGNOSIS — R7989 Other specified abnormal findings of blood chemistry: Secondary | ICD-10-CM | POA: Diagnosis not present

## 2023-03-09 DIAGNOSIS — K9089 Other intestinal malabsorption: Secondary | ICD-10-CM

## 2023-03-09 DIAGNOSIS — R933 Abnormal findings on diagnostic imaging of other parts of digestive tract: Secondary | ICD-10-CM

## 2023-03-09 MED ORDER — CHOLESTYRAMINE 4 GM/DOSE PO POWD: 4.0000 g | Freq: Every day | ORAL | 5 refills | Status: AC

## 2023-03-09 NOTE — Telephone Encounter (Signed)
LMOVM to call back to schedule TCS with Dr. Abbey Chatters ASA 3 ?

## 2023-03-09 NOTE — Addendum Note (Signed)
 Addended by: Armstead Peaks on: 03/09/2023 10:56 AM   Modules accepted: Orders

## 2023-03-09 NOTE — Patient Instructions (Signed)
 I am going to order a liver biopsy to further evaluate your abnormal liver tests.  For your diarrhea, I have sent in a prescription for cholestyramine 4 g daily.  Will schedule for colonoscopy for colon cancer screening purposes as well as to evaluate your chronic diarrhea.  I am going to refer you to vascular surgery in regards to your throat issues.  It was very nice seeing both you today.  Dr. Marletta Lor

## 2023-03-09 NOTE — Progress Notes (Signed)
 Referring Provider: Richardean Chimera, MD Primary Care Physician:  Richardean Chimera, MD Primary GI:  Dr. Marletta Lor  Chief Complaint  Patient presents with   Follow-up    Follow up, pt states his GERD is no better    HPI:   Frederick Alexander is a 53 y.o. male who presents to clinic today for follow-up visit.  Chronic GERD: Describes tickle in his throat triggered by eating or turning his head. Tried cough drops, cough medications, gargled with peroxide, but nothing helps.   Saw ENT and was started on an allergy pill and gabapentin, but this hasn't helped.   Having reflux of some nasty liquid 1-2 times a day.  Taking pantoprazole twice daily.  Previously on omeprazole without relief.  Continues to have breakthrough symptoms.   Taking aleve once daily for leg pain.   EGD 10/16/2021: Gastritis biopsied, mucosal variant in the duodenum biopsied.  Recommended PPI twice daily, better controlled diabetes, ENT referral, consider CT soft tissue neck.  Surgical pathology with H. pylori gastritis.    Completed quad therapy with tetracycline, metronidazole, bismuth, pantoprazole.  Negative eradication testing 12/23/2022.  BPE 12/08/22: Esophagus: Transient obliquely oriented area of incomplete distension at the level of the high thoracic esophagus adjacent to the aortic arch is favored to be secondary to mass effect from the adjacent right brachiocephalic artery. Transient stasis of contrast at this location is ultimately cleared with additional dry swallows and ultimately adequate distention of the esophagus at this location of the visualized. No esophageal stricture, mass or ulceration.  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  Elevated LFTs: Chronic. Elevated alk phos dating back to 2014, ALT became elevated in 2019 and AST elevated and 2022.  RUQ ultrasound with normal-appearing liver in February 2022.  No signs or symptoms of decompensated liver disease.  Has  denied any history of alcohol use, OTC supplements/herbal teas, illicit drug use, Tylenol, personal or family history of autoimmune conditions, family history of liver disease.    Extensive serologic workup has been negative for Hep B and C. Hep A IgM and Hep B core IgM completed rather than total, but both were negative. IgM 240 (H), IgA and IgG within normal limits.  AMA, ANA within normal limits.  Ceruloplasmin 26.  TSH within normal limits.  Celiac screen negative.  Alpha-1 antitrypsin and ASMA negative.    Most recent labs 11/08/2022: AST 59, ALT 106, alk phos 255. Glucose 429 Creatinine 1.32 Platelets 195. Hemoglobin A1c 11.9 TSH 1.78  RUQ [place] 12/15/2022, status post cholecystectomy, hepatic steatosis.  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  Wishes to go back on cholestyramine powder which was controlling his symptoms.   Colon cancer screening: Needs first-ever screening colonoscopy.  No alarm symptoms.  No family history of colorectal malignancy.  No melena hematochezia.  Past Medical History:  Diagnosis Date   Diabetes mellitus (HCC)    type 2   Hyperlipidemia     Past Surgical History:  Procedure Laterality Date   BIOPSY  10/16/2021   Procedure: BIOPSY;  Surgeon: Lanelle Bal, DO;  [facility]: [facility];  Service: Endoscopy;;   CHOLECYSTECTOMY  10/04/2016   Dr. Gabriel Cirri   ESOPHAGOGASTRODUODENOSCOPY (EGD) WITH PROPOFOL N/A 10/16/2021   Procedure: ESOPHAGOGASTRODUODENOSCOPY (EGD) WITH PROPOFOL;  Surgeon: Lanelle Bal, DO;  Location: [facility];  Service: Endoscopy;  Laterality: N/A;  9:00 am   RIGHT/LEFT HEART  CATH AND CORONARY ANGIOGRAPHY N/A 03/25/2017   Procedure: RIGHT/LEFT HEART CATH AND CORONARY ANGIOGRAPHY;  Surgeon: Corky Crafts, MD;  Location: [facility] INVASIVE CV LAB;  Service: Cardiovascular;  Laterality: N/A;    Current Outpatient Medications  Medication Sig Dispense Refill    colestipol (COLESTID) 1 g tablet Take 2 tablets (2 g total) by mouth daily. 60 tablet 3   Continuous Glucose Sensor (DEXCOM G6 SENSOR) MISC Apply topically.     Continuous Glucose Transmitter (DEXCOM G6 TRANSMITTER) MISC USE AS DIRECTED FOR 90 DAYS     gabapentin (NEURONTIN) 300 MG capsule TAKE ONE CAPSULE BY MOUTH DAILY FOR 7 DAYS THE MAY increase TO 2 CAPSULES DAILY     Insulin Glargine (LANTUS SOLOSTAR) 100 UNIT/ML Solostar Pen Inject 40 Units into the skin at bedtime.     metFORMIN (GLUCOPHAGE-XR) 500 MG 24 hr tablet Take 1 tablet (500 mg total) by mouth 2 (two) times daily after a meal. (Patient taking differently: Take 1,000 mg by mouth 2 (two) times daily after a meal.)     montelukast (SINGULAIR) 10 MG tablet Take 1 tablet by mouth at bedtime.     naproxen sodium (ALEVE) 220 MG tablet Take 220 mg by mouth daily.      pantoprazole (PROTONIX) 40 MG tablet Take 1 tablet (40 mg total) by mouth 2 (two) times daily before a meal. 60 tablet 3   ASPIRIN 81 PO Take 1 tablet by mouth daily. (Patient not taking: Reported on 03/09/2023)     guaiFENesin (MUCUS RELIEF) 600 MG 12 hr tablet Take by mouth 2 (two) times daily. (Patient not taking: Reported on 03/09/2023)     vitamin C (ASCORBIC ACID) 250 MG tablet Take 250 mg by mouth daily. (Patient not taking: Reported on 03/09/2023)     No current facility-administered medications for this visit.    Allergies as of 03/09/2023 - Review Complete 03/09/2023  Allergen Reaction Noted   Prednisone Other (See Comments) 05/27/2010    Family History  Problem Relation Age of Onset   Diabetes Maternal Grandmother    Colon cancer Neg Hx    Gastric cancer Neg Hx    Esophageal cancer Neg Hx    Inflammatory bowel disease Neg Hx    Liver disease Neg Hx    Autoimmune disease Neg Hx     Social History   Socioeconomic History   Marital status: Married    Spouse name: Not on file   Number of children: Not on file   Years of education: Not on file   Highest  education level: Not on file  Occupational History   Not on file  Tobacco Use   Smoking status: Never   Smokeless tobacco: Never  Vaping Use   Vaping status: Never Used  Substance and Sexual Activity   Alcohol use: Never   Drug use: Never   Sexual activity: Not on file  Other Topics Concern   Not on file  Social History Narrative   Not on file   Social Drivers of Health   Financial Resource Strain: Not on file  Food Insecurity: Not on file  Transportation Needs: Not on file  Physical Activity: Not on file  Stress: Not on file  Social Connections: Not on file    Subjective: Review of Systems  Constitutional:  Negative for chills and fever.  HENT:  Negative for congestion and hearing loss.   Eyes:  Negative for blurred vision and double vision.  Respiratory:  Negative for cough and shortness of  breath.   Cardiovascular:  Negative for chest pain and palpitations.  Gastrointestinal:  Positive for diarrhea and heartburn. Negative for abdominal pain, blood in stool, constipation, melena and vomiting.  Genitourinary:  Negative for dysuria and urgency.  Musculoskeletal:  Negative for joint pain and myalgias.  Skin:  Negative for itching and rash.  Neurological:  Negative for dizziness and headaches.  Psychiatric/Behavioral:  Negative for depression. The patient is not nervous/anxious.      Objective: BP (!) 178/101   Pulse 87   Temp 98.5 F (36.9 C)   Ht 5\' 11"  (1.803 m)   Wt 182 lb 12.8 oz (82.9 kg)   BMI 25.50 kg/m  Physical Exam Constitutional:      Appearance: Normal appearance.  HENT:     Head: Normocephalic and atraumatic.  Eyes:     Extraocular Movements: Extraocular movements intact.     Conjunctiva/sclera: Conjunctivae normal.  Cardiovascular:     Rate and Rhythm: Normal rate and regular rhythm.  Pulmonary:     Effort: Pulmonary effort is normal.     Breath sounds: Normal breath sounds.  Abdominal:     General: Bowel sounds are normal.      Palpations: Abdomen is soft.  Musculoskeletal:        General: Normal range of motion.     Cervical back: Normal range of motion and neck supple.  Skin:    General: Skin is warm.  Neurological:     General: No focal deficit present.     Mental Status: He is alert and oriented to person, place, and time.  Psychiatric:        Mood and Affect: Mood normal.        Behavior: Behavior normal.      Assessment/Plan:  1.  Dysphagia, throat irritation, GERD-has tried numerous PPIs without relief.  Status post esophageal dilation which did not help.  Given his BPE findings as well as the CT angio, ?Dysphagia lusoria.  Patient does report this is the area of concern.  Will refer to vascular surgery for evaluation.  Appreciate their help in this regard.  2.  Abnormal LFTs, hepatic steatosis-significant workup per HPI.  Will reorder liver biopsy to further evaluate.  Needs to keep his A1c under better control.  Needs to keep his hypertension under better control.  Recommend 1-2# weight loss per week until ideal body weight through exercise & diet. Low fat/cholesterol diet.   Avoid sweets, sodas, fruit juices, sweetened beverages like tea, etc. Gradually increase exercise from 15 min daily up to 1 hr per day 5 days/week. Limit alcohol use.  3.  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  Previously well-controlled on cholestyramine, uncontrolled on Colestid.  Will send in prescription for cholestyramine 4 g daily.  Increase to twice daily as needed.  4.  Colon cancer screening- Will schedule for screening colonoscopy.The risks including infection, bleed, or perforation as well as benefits, limitations, alternatives and imponderables have been reviewed with the patient. Questions have been answered. All parties agreeable.   03/09/2023 10:15 AM   Disclaimer: This note was dictated with voice recognition software. Similar sounding words can inadvertently be transcribed and may not be corrected  upon review.

## 2023-03-10 NOTE — Telephone Encounter (Signed)
LMOVM to call back to schedule 

## 2023-03-15 ENCOUNTER — Other Ambulatory Visit: Payer: Self-pay | Admitting: Gastroenterology

## 2023-03-15 DIAGNOSIS — K9089 Other intestinal malabsorption: Secondary | ICD-10-CM

## 2023-03-17 MED ORDER — PEG 3350-KCL-NA BICARB-NACL 420 G PO SOLR: 4000.0000 mL | Freq: Once | ORAL | 0 refills | Status: AC

## 2023-03-17 NOTE — Telephone Encounter (Signed)
 Patient has been scheduled for 4/7 at 7:30am. Awre will send instructions and rx for prep to pharmacy.

## 2023-04-04 ENCOUNTER — Other Ambulatory Visit: Payer: Self-pay | Admitting: Radiology

## 2023-04-04 DIAGNOSIS — R7989 Other specified abnormal findings of blood chemistry: Secondary | ICD-10-CM

## 2023-04-04 NOTE — H&P (Signed)
 Chief Complaint: Elevated LFT's - IR consulted for US guided liver biopsy  Referring Provider(s): Dr. Earnest Bailey  Supervising Physician: Roanna Banning  Patient Status: [facility] - Out-pt  History of Present Illness: Frederick Alexander is a 53 y.o. male with hx of DM2, HLD, GERD, presenting to IR service from Dr. Marletta Lor with GI due to concern of persistently elevated LFTs. This dates back to 2014 for alkaline phosphatase, 2019 for ALT, and 2022 for AST. RUQ US done 12/15/22 that showed hepatic steatosis. Has had extensive lab workup for Hep C and B, which was negative. AMA, ANA within normal limits.  Ceruloplasmin 26.  TSH within normal limits.  Celiac screen negative.  Alpha-1 antitrypsin and ASMA negative.    *** Patient is Full Code  Past Medical History:  Diagnosis Date   Diabetes mellitus (HCC)    type 2   Hyperlipidemia     Past Surgical History:  Procedure Laterality Date   BIOPSY  10/16/2021   Procedure: BIOPSY;  Surgeon: Lanelle Bal, DO;  [facility];  Service: Endoscopy;;   CHOLECYSTECTOMY  10/04/2016   Dr. Gabriel Cirri   ESOPHAGOGASTRODUODENOSCOPY (EGD) WITH PROPOFOL N/A 10/16/2021   Procedure: ESOPHAGOGASTRODUODENOSCOPY (EGD) WITH PROPOFOL;  Surgeon: Lanelle Bal, DO;  [facility];  Service: Endoscopy;  Laterality: N/A;  9:00 am   RIGHT/LEFT HEART CATH AND CORONARY ANGIOGRAPHY N/A 03/25/2017   Procedure: RIGHT/LEFT HEART CATH AND CORONARY ANGIOGRAPHY;  Surgeon: Corky Crafts, MD;  Location: [facility] INVASIVE CV LAB;  Service: Cardiovascular;  Laterality: N/A;    Allergies: Prednisone  Medications: Prior to Admission medications   Medication Sig Start Date End Date Taking? Authorizing Provider  ASPIRIN 81 PO Take 1 tablet by mouth daily. Patient not taking: Reported on 03/09/2023    [provider]  cholestyramine Lanetta Inch) 4 GM/DOSE powder Take 1 packet (4 g total) by mouth daily. Mix with 4-6 oz liquid. Take other meds 1 hr  before or 4-6 hr after cholestyramine. 03/09/23 09/05/23  Lanelle Bal, DO  Continuous Glucose Sensor (DEXCOM G6 SENSOR) MISC Apply topically. 11/10/22   [provider]  Continuous Glucose Transmitter (DEXCOM G6 TRANSMITTER) MISC USE AS DIRECTED FOR 90 DAYS 11/29/22   [provider]  gabapentin (NEURONTIN) 300 MG capsule TAKE ONE CAPSULE BY MOUTH DAILY FOR 7 DAYS THE MAY increase TO 2 CAPSULES DAILY    [provider]  guaiFENesin (MUCUS RELIEF) 600 MG 12 hr tablet Take by mouth 2 (two) times daily. Patient not taking: Reported on 03/09/2023    [provider]  Insulin Glargine (LANTUS SOLOSTAR) 100 UNIT/ML Solostar Pen Inject 40 Units into the skin at bedtime. 01/08/17   [provider]  metFORMIN (GLUCOPHAGE-XR) 500 MG 24 hr tablet Take 1 tablet (500 mg total) by mouth 2 (two) times daily after a meal. Patient taking differently: Take 1,000 mg by mouth 2 (two) times daily after a meal. 09/20/18   Nida, Denman George, MD  montelukast (SINGULAIR) 10 MG tablet Take 1 tablet by mouth at bedtime.    [provider]  naproxen sodium (ALEVE) 220 MG tablet Take 220 mg by mouth daily.     [provider]  pantoprazole (PROTONIX) 40 MG tablet Take 1 tablet (40 mg total) by mouth 2 (two) times daily before a meal. 01/02/23   Letta Median, PA-C  vitamin C (ASCORBIC ACID) 250 MG tablet Take 250 mg by mouth daily. Patient not taking: Reported on 03/09/2023  [provider]     Family History  Problem Relation Age of Onset   Diabetes Maternal Grandmother    Colon cancer Neg Hx    Gastric cancer Neg Hx    Esophageal cancer Neg Hx    Inflammatory bowel disease Neg Hx    Liver disease Neg Hx    Autoimmune disease Neg Hx     Social History   Socioeconomic History   Marital status: Married    Spouse name: Not on file   Number of children: Not on file   Years of education: Not on file   Highest education level: Not on file   Occupational History   Not on file  Tobacco Use   Smoking status: Never   Smokeless tobacco: Never  Vaping Use   Vaping status: Never Used  Substance and Sexual Activity   Alcohol use: Never   Drug use: Never   Sexual activity: Not on file  Other Topics Concern   Not on file  Social History Narrative   Not on file   Social Drivers of Health   Financial Resource Strain: Not on file  Food Insecurity: Not on file  Transportation Needs: Not on file  Physical Activity: Not on file  Stress: Not on file  Social Connections: Not on file     Review of Systems: A 12 point ROS discussed and pertinent positives are indicated in the HPI above.  All other systems are negative.  Review of Systems  Vital Signs: There were no vitals taken for this visit.  Advance Care Plan: No Documents available  Physical Exam  Imaging: No results found.  Labs:  CBC: No results for input(s): "WBC", "HGB", "HCT", "PLT" in the last 8760 hours.  COAGS: No results for input(s): "INR", "APTT" in the last 8760 hours.  BMP: Recent Labs    12/23/22 1330  CREATININE 1.10    LIVER FUNCTION TESTS: No results for input(s): "BILITOT", "AST", "ALT", "ALKPHOS", "PROT", "ALBUMIN" in the last 8760 hours.  TUMOR MARKERS: No results for input(s): "AFPTM", "CEA", "CA199", "CHROMGRNA" in the last 8760 hours.  Assessment and Plan:  53 y.o. male with hx of DM2, HLD, GERD, presenting to IR service from Dr. Marletta Lor, GI,  due to concern of persistently elevated LFTs. This dates back to 2014 for alkaline phosphatase, 2019 for ALT, and 2022 for AST. RUQ US done 12/15/22 that showed hepatic steatosis. Has had extensive lab workup for Hep C and B, which was negative. AMA, ANA within normal limits.  Ceruloplasmin 26.  TSH within normal limits.  Celiac screen negative.  Alpha-1 antitrypsin and ASMA negative.    Risks and benefits of US guided liver biopsy was discussed with the patient and/or patient's family  including, but not limited to bleeding, infection, damage to adjacent structures or low yield requiring additional tests.  All of the questions were answered and there is agreement to proceed.  Consent signed and in chart.   Thank you for allowing our service to participate in Frederick Alexander 's care.  Electronically Signed: Katheren Puller, PA-C   04/04/2023, 1:47 PM      I spent a total of  30 Minutes   in face to face in clinical consultation, greater than 50% of which was counseling/coordinating care for US guided liver biopsy.

## 2023-04-05 ENCOUNTER — Encounter (HOSPITAL_COMMUNITY): Payer: Self-pay

## 2023-04-05 ENCOUNTER — Other Ambulatory Visit: Payer: Self-pay

## 2023-04-05 ENCOUNTER — Other Ambulatory Visit: Payer: Self-pay | Admitting: Radiology

## 2023-04-05 ENCOUNTER — Ambulatory Visit (HOSPITAL_COMMUNITY)
Admission: RE | Admit: 2023-04-05 | Discharge: 2023-04-05 | Disposition: A | Discharge status: Home / Self Care | Source: Ambulatory Visit | Attending: Internal Medicine | Admitting: Internal Medicine

## 2023-04-05 ENCOUNTER — Ambulatory Visit (HOSPITAL_COMMUNITY)
Admission: RE | Admit: 2023-04-05 | Discharge: 2023-04-05 | Disposition: A | Discharge status: Home / Self Care | Source: Ambulatory Visit | Attending: Family Medicine | Admitting: Family Medicine

## 2023-04-05 DIAGNOSIS — R1032 Left lower quadrant pain: Secondary | ICD-10-CM | POA: Insufficient documentation

## 2023-04-05 DIAGNOSIS — E785 Hyperlipidemia, unspecified: Secondary | ICD-10-CM | POA: Insufficient documentation

## 2023-04-05 DIAGNOSIS — R112 Nausea with vomiting, unspecified: Secondary | ICD-10-CM | POA: Insufficient documentation

## 2023-04-05 DIAGNOSIS — K219 Gastro-esophageal reflux disease without esophagitis: Secondary | ICD-10-CM | POA: Insufficient documentation

## 2023-04-05 DIAGNOSIS — Z794 Long term (current) use of insulin: Secondary | ICD-10-CM | POA: Diagnosis not present

## 2023-04-05 DIAGNOSIS — Z7984 Long term (current) use of oral hypoglycemic drugs: Secondary | ICD-10-CM | POA: Diagnosis not present

## 2023-04-05 DIAGNOSIS — R0600 Dyspnea, unspecified: Secondary | ICD-10-CM | POA: Diagnosis not present

## 2023-04-05 DIAGNOSIS — R7989 Other specified abnormal findings of blood chemistry: Secondary | ICD-10-CM | POA: Insufficient documentation

## 2023-04-05 DIAGNOSIS — E119 Type 2 diabetes mellitus without complications: Secondary | ICD-10-CM | POA: Insufficient documentation

## 2023-04-05 DIAGNOSIS — K760 Fatty (change of) liver, not elsewhere classified: Secondary | ICD-10-CM | POA: Insufficient documentation

## 2023-04-05 LAB — COMPREHENSIVE METABOLIC PANEL WITH GFR
ALT: 73 U/L — ABNORMAL HIGH (ref 0–44)
AST: 53 U/L — ABNORMAL HIGH (ref 15–41)
Albumin: 3.5 g/dL (ref 3.5–5.0)
Alkaline Phosphatase: 178 U/L — ABNORMAL HIGH (ref 38–126)
Anion gap: 7 (ref 5–15)
BUN: 32 mg/dL — ABNORMAL HIGH (ref 6–20)
CO2: 26 mmol/L (ref 22–32)
Calcium: 8.7 mg/dL — ABNORMAL LOW (ref 8.9–10.3)
Chloride: 101 mmol/L (ref 98–111)
Creatinine, Ser: 1.05 mg/dL (ref 0.61–1.24)
GFR, Estimated: >60 mL/min (ref >60)
Glucose, Bld: 403 mg/dL — ABNORMAL HIGH (ref 70–99)
Potassium: 4.3 mmol/L (ref 3.5–5.1)
Sodium: 134 mmol/L — ABNORMAL LOW (ref 135–145)
Total Bilirubin: 0.3 mg/dL (ref 0.0–1.2)
Total Protein: 6.3 g/dL — ABNORMAL LOW (ref 6.5–8.1)

## 2023-04-05 LAB — CBC
HCT: 33.7 % — ABNORMAL LOW (ref 39.0–52.0)
Hemoglobin: 11.8 g/dL — ABNORMAL LOW (ref 13.0–17.0)
MCH: 29.8 pg (ref 26.0–34.0)
MCHC: 35 g/dL (ref 30.0–36.0)
MCV: 85.1 fL (ref 80.0–100.0)
Platelets: 198 10*3/uL (ref 150–400)
RBC: 3.96 MIL/uL — ABNORMAL LOW (ref 4.22–5.81)
RDW: 12.7 % (ref 11.5–15.5)
WBC: 6.7 10*3/uL (ref 4.0–10.5)
nRBC: 0 % (ref 0.0–0.2)

## 2023-04-05 LAB — PROTIME-INR
INR: 0.9 (ref 0.8–1.2)
Prothrombin Time: 12.2 s (ref 11.4–15.2)

## 2023-04-05 LAB — GLUCOSE, CAPILLARY: Glucose-Capillary: 370 mg/dL — ABNORMAL HIGH (ref 70–99)

## 2023-04-05 MED ORDER — MIDAZOLAM HCL 2 MG/2ML IJ SOLN
INTRAMUSCULAR | Status: AC | PRN
  Administered 2023-04-05: 1 mg via INTRAVENOUS

## 2023-04-05 MED ORDER — SODIUM CHLORIDE 0.9 % IV SOLN: INTRAVENOUS | Status: DC

## 2023-04-05 MED ORDER — LIDOCAINE HCL 1 % IJ SOLN
INTRAMUSCULAR | Status: AC
  Filled 2023-04-05: qty 20

## 2023-04-05 MED ORDER — MIDAZOLAM HCL 2 MG/2ML IJ SOLN
INTRAMUSCULAR | Status: AC
  Filled 2023-04-05: qty 4

## 2023-04-05 MED ORDER — MIDAZOLAM HCL 2 MG/2ML IJ SOLN
INTRAMUSCULAR | Status: AC | PRN
  Administered 2023-04-05: 2 mg via INTRAVENOUS

## 2023-04-05 MED ORDER — FENTANYL CITRATE (PF) 100 MCG/2ML IJ SOLN
INTRAMUSCULAR | Status: AC
Start: 2023-04-05 — End: ?
  Filled 2023-04-05: qty 2

## 2023-04-05 MED ORDER — FENTANYL CITRATE (PF) 100 MCG/2ML IJ SOLN
INTRAMUSCULAR | Status: AC | PRN
Start: 2023-04-05 — End: 2023-04-05
  Administered 2023-04-05: 50 ug via INTRAVENOUS

## 2023-04-05 NOTE — Procedures (Signed)
 Vascular and Interventional Radiology Procedure Note  Patient: Frederick Alexander DOB: 12-Mar-1970 Medical Record Number: [id number] Note Date/Time: 04/05/23 12:39 PM   Performing Physician: Roanna Banning, MD Assistant(s): None  Diagnosis: >LFTs   Procedure: LIVER BIOPSY, NON-TARGETED  Anesthesia: Conscious Sedation Complications: None Estimated Blood Loss: Minimal Specimens: Sent for Pathology  Findings:  Successful Ultrasound-guided biopsy of liver. A total of 3 samples were obtained. Hemostasis of the tract was achieved using Manual Pressure.  Plan: Bed rest for 2 hours.  See detailed procedure note with images in PACS. The patient tolerated the procedure well without incident or complication and was returned to Recovery in stable condition.    Roanna Banning, MD [facility] Interventional [facility] Radiology   Pager. [phone] Clinic. [phone]

## 2023-04-05 NOTE — Discharge Instructions (Signed)
Please call Interventional Radiology clinic [phone] with any questions or concerns.  You may remove your dressing and shower tomorrow.  After the procedure, it is common to have: Soreness Bruising Mild pain You may also feel tired for a few days  Follow these instructions at home:  Medication: Do not use Aspirin or ibuprofen products, such as Advil or Motrin, as it may increase bleeding.  You may resume your usual medications as ordered by your doctor If your doctor prescribed antibiotics, take them as directed. Do not stop taking them just because you feel better. You need to take the full course of antibiotics.  Eating and drinking: Drink plenty of liquids to keep your urine pale yellow You can resume your regular diet as directed by your doctor  Care of the procedure site Follow instructions from your doctor about how to take care of your cut from surgery (incisions). Make sure you: Wash your hands with soap and water for at least 20 seconds before and after you change your bandage. If you cannot use soap and water, use hand sanitizer Change your bandage Leave stitches or skin glue in place for at least two weeks Leave tape strips alone unless you are told to take them off. You may trim the edges of the tape strips if they curl up. Check your incision every day for signs of infection. Check for: Redness, swelling, or more pain Fluid or blood Warmth Pus or a bad smell  Activity Rest at home for 1-2 days, or as told by your doctor Get up to take short walks every 1 to 2 hours. Ask for help if you feel weak or unsteady Do not lift anything that is heavier than 10 lb (4.5 kg), or the limit that you are told Do not play contact sports for 2 weeks after the procedure Return to your normal activities as told by your doctor Do not take baths, swim, or use a hot tub until your health care provider approves. Take showers only. Keep all follow-up visits as told by your  doctor  Contact a doctor if: You have more bleeding in your incision Your incision swells, or is red and more painful You have fluid that comes from your incision You develop a rash You have fever or chills  Get help right away if: You have swelling, bloating, or pain in your belly (abdomen) You get dizzy or faint You vomit or you feel like vomiting You have trouble breathing or feel short of breath You have chest pain You have problems talking or seeing You have trouble with your balance or moving your arms or legs  Moderate Conscious Sedation-Care After  This sheet gives you information about how to care for yourself after your procedure. Your health care provider may also give you more specific instructions. If you have problems or questions, contact your health care provider.  After the procedure, it is common to have: Sleepiness for several hours. Impaired judgment for several hours. Difficulty with balance. Vomiting if you eat too soon.  Follow these instructions at home:  Rest. Do not participate in activities where you could fall or become injured. Do not drive or use machinery. Do not drink alcohol. Do not take sleeping pills or medicines that cause drowsiness. Do not make important decisions or sign legal documents. Do not take care of children on your own.  Eating and drinking Follow the diet recommended by your health care provider. Drink enough fluid to keep your urine pale yellow.  If you vomit: Drink water, juice, or soup when you can drink without vomiting. Make sure you have little or no nausea before eating solid foods.  General instructions Take over-the-counter and prescription medicines only as told by your health care provider. Have a responsible adult stay with you for the time you are told. It is important to have someone help care for you until you are awake and alert. Do not smoke. Keep all follow-up visits as told by your health care  provider. This is important.  Contact a health care provider if: You are still sleepy or having trouble with balance after 24 hours. You feel light-headed. You keep feeling nauseous or you keep vomiting. You develop a rash. You have a fever. You have redness or swelling around the IV site.  Get help right away if: You have trouble breathing. You have new-onset confusion at home.  This information is not intended to replace advice given to you by your health care provider. Make sure you discuss any questions you have with your healthcare provider.

## 2023-04-06 LAB — SURGICAL PATHOLOGY

## 2023-04-06 NOTE — Patient Instructions (Signed)
 Frederick Alexander  04/06/2023     @PREFPERIOPPHARMACY @   Your procedure is scheduled on  04/11/2023.   Report to [facility] at  0600 A.M.   Call this number if you have problems the morning of surgery:  [phone]  If you experience any cold or flu symptoms such as cough, fever, chills, shortness of breath, etc. between now and your scheduled surgery, please notify us at the above number.   Remember:         Take 1/2 of your usual insulin dosage the night before your procedure.        DO NOT take any medications for diabetes the morning of your procedure.    Follow the diet and prep instructions given to you by the office.   You may drink clear liquids until 0330 am on 04/11/2023.      Clear liquids allowed are:                    Water, Juice (No red color; non-citric and without pulp; diabetics please choose diet or no sugar options), Carbonated beverages (diabetics please choose diet or no sugar options), Clear Tea (No creamer, milk, or cream, including half & half and powdered creamer), Black Coffee Only (No creamer, milk or cream, including half & half and powdered creamer), and Clear Sports drink (No red color; diabetics please choose diet or no sugar options)     Take these medicines the morning of surgery with A SIP OF WATER                                     gabapentin, pantoprazole.    Do not wear jewelry, make-up or nail polish, including gel polish,  artificial nails, or any other type of covering on natural nails (fingers and  toes).  Do not wear lotions, powders, or perfumes, or deodorant.  Do not shave 48 hours prior to surgery.  Men may shave face and neck.  Do not bring valuables to the hospital.  [facility] is not responsible for any belongings or valuables.  Contacts, dentures or bridgework may not be worn into surgery.  Leave your suitcase in the car.  After surgery it may be brought to your room.  For patients admitted to the hospital, discharge  time will be determined by your treatment team.  Patients discharged the day of surgery will not be allowed to drive home and must have someone with them for 24 hours.    Special instructions:  DO NOT smoke tobacco or vape for 24 hours before your procedure.  Please read over the following fact sheets that you were given. Anesthesia Post-op Instructions and Care and Recovery After Surgery      Colonoscopy, Adult, Care After The following information offers guidance on how to care for yourself after your procedure. Your health care provider may also give you more specific instructions. If you have problems or questions, contact your health care provider. What can I expect after the procedure? After the procedure, it is common to have: A small amount of blood in your stool for 24 hours after the procedure. Some gas. Mild cramping or bloating of your abdomen. Follow these instructions at home: Eating and drinking  Drink enough fluid to keep your urine pale yellow. Follow instructions from your health care provider about eating or drinking restrictions. Resume your normal  diet as told by your health care provider. Avoid heavy or fried foods that are hard to digest. Activity Rest as told by your health care provider. Avoid sitting for a long time without moving. Get up to take short walks every 1-2 hours. This is important to improve blood flow and breathing. Ask for help if you feel weak or unsteady. Return to your normal activities as told by your health care provider. Ask your health care provider what activities are safe for you. Managing cramping and bloating  Try walking around when you have cramps or feel bloated. If directed, apply heat to your abdomen as told by your health care provider. Use the heat source that your health care provider recommends, such as a moist heat pack or a heating pad. Place a towel between your skin and the heat source. Leave the heat on for 20-30  minutes. Remove the heat if your skin turns bright red. This is especially important if you are unable to feel pain, heat, or cold. You have a greater risk of getting burned. General instructions If you were given a sedative during the procedure, it can affect you for several hours. Do not drive or operate machinery until your health care provider says that it is safe. For the first 24 hours after the procedure: Do not sign important documents. Do not drink alcohol. Do your regular daily activities at a slower pace than normal. Eat soft foods that are easy to digest. Take over-the-counter and prescription medicines only as told by your health care provider. Keep all follow-up visits. This is important. Contact a health care provider if: You have blood in your stool 2-3 days after the procedure. Get help right away if: You have more than a small spotting of blood in your stool. You have large blood clots in your stool. You have swelling of your abdomen. You have nausea or vomiting. You have a fever. You have increasing pain in your abdomen that is not relieved with medicine. These symptoms may be an emergency. Get help right away. Call 911. Do not wait to see if the symptoms will go away. Do not drive yourself to the hospital. Summary After the procedure, it is common to have a small amount of blood in your stool. You may also have mild cramping and bloating of your abdomen. If you were given a sedative during the procedure, it can affect you for several hours. Do not drive or operate machinery until your health care provider says that it is safe. Get help right away if you have a lot of blood in your stool, nausea or vomiting, a fever, or increased pain in your abdomen. This information is not intended to replace advice given to you by your health care provider. Make sure you discuss any questions you have with your health care provider. Document Revised: 02/02/2022 Document Reviewed:  08/13/2020 Elsevier Patient Education  2024 Elsevier Inc.General Anesthesia, Adult, Care After The following information offers guidance on how to care for yourself after your procedure. Your health care provider may also give you more specific instructions. If you have problems or questions, contact your health care provider. What can I expect after the procedure? After the procedure, it is common for people to: Have pain or discomfort at the IV site. Have nausea or vomiting. Have a sore throat or hoarseness. Have trouble concentrating. Feel cold or chills. Feel weak, sleepy, or tired (fatigue). Have soreness and body aches. These can affect parts of the body  that were not involved in surgery. Follow these instructions at home: For the time period you were told by your health care provider:  Rest. Do not participate in activities where you could fall or become injured. Do not drive or use machinery. Do not drink alcohol. Do not take sleeping pills or medicines that cause drowsiness. Do not make important decisions or sign legal documents. Do not take care of children on your own. General instructions Drink enough fluid to keep your urine pale yellow. If you have sleep apnea, surgery and certain medicines can increase your risk for breathing problems. Follow instructions from your health care provider about wearing your sleep device: Anytime you are sleeping, including during daytime naps. While taking prescription pain medicines, sleeping medicines, or medicines that make you drowsy. Return to your normal activities as told by your health care provider. Ask your health care provider what activities are safe for you. Take over-the-counter and prescription medicines only as told by your health care provider. Do not use any products that contain nicotine or tobacco. These products include cigarettes, chewing tobacco, and vaping devices, such as e-cigarettes. These can delay incision  healing after surgery. If you need help quitting, ask your health care provider. Contact a health care provider if: You have nausea or vomiting that does not get better with medicine. You vomit every time you eat or drink. You have pain that does not get better with medicine. You cannot urinate or have bloody urine. You develop a skin rash. You have a fever. Get help right away if: You have trouble breathing. You have chest pain. You vomit blood. These symptoms may be an emergency. Get help right away. Call 911. Do not wait to see if the symptoms will go away. Do not drive yourself to the hospital. Summary After the procedure, it is common to have a sore throat, hoarseness, nausea, vomiting, or to feel weak, sleepy, or fatigue. For the time period you were told by your health care provider, do not drive or use machinery. Get help right away if you have difficulty breathing, have chest pain, or vomit blood. These symptoms may be an emergency. This information is not intended to replace advice given to you by your health care provider. Make sure you discuss any questions you have with your health care provider. Document Revised: 03/20/2021 Document Reviewed: 03/20/2021 Elsevier Patient Education  2024 [employer].

## 2023-04-07 ENCOUNTER — Encounter (HOSPITAL_COMMUNITY)
Admission: RE | Admit: 2023-04-07 | Discharge: 2023-04-07 | Disposition: A | Discharge status: Home / Self Care | Source: Ambulatory Visit | Attending: Internal Medicine | Admitting: Internal Medicine

## 2023-04-07 ENCOUNTER — Encounter (HOSPITAL_COMMUNITY): Payer: Self-pay

## 2023-04-07 VITALS — BP 123/77 | HR 69 | Temp 98.3°F | Resp 18 | Ht 71.0 in | Wt 170.0 lb

## 2023-04-07 DIAGNOSIS — E119 Type 2 diabetes mellitus without complications: Secondary | ICD-10-CM | POA: Insufficient documentation

## 2023-04-07 DIAGNOSIS — Z01810 Encounter for preprocedural cardiovascular examination: Secondary | ICD-10-CM | POA: Insufficient documentation

## 2023-04-08 NOTE — Anesthesia Preprocedure Evaluation (Addendum)
 Anesthesia Evaluation  Patient identified by MRN, date of birth, ID band Patient awake    Reviewed: Allergy & Precautions, NPO status , Patient's Chart, lab work & pertinent test results  Airway Mallampati: II  TM Distance: >3 FB Neck ROM: Full    Dental no notable dental hx. (+) Dental Advisory Given, Teeth Intact   Pulmonary neg pulmonary ROS   Pulmonary exam normal breath sounds clear to auscultation       Cardiovascular negative cardio ROS Normal cardiovascular exam Rhythm:Regular Rate:Normal     Neuro/Psych negative neurological ROS  negative psych ROS   GI/Hepatic Neg liver ROS,GERD  Medicated,,  Endo/Other  diabetes, Poorly Controlled, Type 2, Oral Hypoglycemic Agents, Insulin Dependent    Renal/GU negative Renal ROS  negative genitourinary   Musculoskeletal negative musculoskeletal ROS (+)    Abdominal   Peds negative pediatric ROS (+)  Hematology negative hematology ROS (+)   Anesthesia Other Findings   Reproductive/Obstetrics negative OB ROS                             Anesthesia Physical Anesthesia Plan  ASA: 4  Anesthesia Plan: General   Post-op Pain Management: Minimal or no pain anticipated   Induction: Intravenous  PONV Risk Score and Plan: Propofol infusion  Airway Management Planned: Nasal Cannula and Natural Airway  Additional Equipment: None  Intra-op Plan:   Post-operative Plan:   Informed Consent: I have reviewed the patients History and Physical, chart, labs and discussed the procedure including the risks, benefits and alternatives for the proposed anesthesia with the patient or authorized representative who has indicated his/her understanding and acceptance.     Dental advisory given  Plan Discussed with: CRNA and Surgeon  Anesthesia Plan Comments:         Anesthesia Quick Evaluation

## 2023-04-11 ENCOUNTER — Ambulatory Visit (HOSPITAL_COMMUNITY): Payer: Self-pay | Admitting: Anesthesiology

## 2023-04-11 ENCOUNTER — Ambulatory Visit (HOSPITAL_COMMUNITY)
Admission: RE | Admit: 2023-04-11 | Discharge: 2023-04-11 | Disposition: A | Discharge status: Home / Self Care | Attending: Internal Medicine | Admitting: Internal Medicine

## 2023-04-11 ENCOUNTER — Encounter (HOSPITAL_COMMUNITY)
Admission: RE | Disposition: A | Discharge status: Home / Self Care | Payer: Self-pay | Source: Home / Self Care | Attending: Internal Medicine

## 2023-04-11 ENCOUNTER — Other Ambulatory Visit: Payer: Self-pay

## 2023-04-11 DIAGNOSIS — Z1211 Encounter for screening for malignant neoplasm of colon: Secondary | ICD-10-CM | POA: Diagnosis present

## 2023-04-11 DIAGNOSIS — K219 Gastro-esophageal reflux disease without esophagitis: Secondary | ICD-10-CM | POA: Diagnosis not present

## 2023-04-11 DIAGNOSIS — Z79899 Other long term (current) drug therapy: Secondary | ICD-10-CM | POA: Insufficient documentation

## 2023-04-11 DIAGNOSIS — D120 Benign neoplasm of cecum: Secondary | ICD-10-CM | POA: Diagnosis not present

## 2023-04-11 DIAGNOSIS — K648 Other hemorrhoids: Secondary | ICD-10-CM | POA: Insufficient documentation

## 2023-04-11 DIAGNOSIS — Z794 Long term (current) use of insulin: Secondary | ICD-10-CM

## 2023-04-11 DIAGNOSIS — E119 Type 2 diabetes mellitus without complications: Secondary | ICD-10-CM

## 2023-04-11 DIAGNOSIS — E1165 Type 2 diabetes mellitus with hyperglycemia: Secondary | ICD-10-CM | POA: Diagnosis not present

## 2023-04-11 DIAGNOSIS — Z7984 Long term (current) use of oral hypoglycemic drugs: Secondary | ICD-10-CM | POA: Diagnosis not present

## 2023-04-11 HISTORY — PX: COLONOSCOPY: SHX5424

## 2023-04-11 HISTORY — PX: POLYPECTOMY: SHX149

## 2023-04-11 LAB — GLUCOSE, CAPILLARY: Glucose-Capillary: 165 mg/dL — ABNORMAL HIGH (ref 70–99)

## 2023-04-11 SURGERY — COLONOSCOPY
Anesthesia: General

## 2023-04-11 MED ORDER — LACTATED RINGERS IV SOLN: INTRAVENOUS | Status: DC | PRN

## 2023-04-11 MED ORDER — PHENYLEPHRINE 80 MCG/ML (10ML) SYRINGE FOR IV PUSH (FOR BLOOD PRESSURE SUPPORT)
PREFILLED_SYRINGE | INTRAVENOUS | Status: AC
  Filled 2023-04-11: qty 10

## 2023-04-11 MED ORDER — PROPOFOL 10 MG/ML IV BOLUS
INTRAVENOUS | Status: DC | PRN
  Administered 2023-04-11: 80 mg via INTRAVENOUS

## 2023-04-11 MED ORDER — LIDOCAINE HCL (PF) 2 % IJ SOLN
INTRAMUSCULAR | Status: AC
  Filled 2023-04-11: qty 5

## 2023-04-11 MED ORDER — PHENYLEPHRINE 80 MCG/ML (10ML) SYRINGE FOR IV PUSH (FOR BLOOD PRESSURE SUPPORT)
PREFILLED_SYRINGE | INTRAVENOUS | Status: DC | PRN
Start: 2023-04-11 — End: 2023-04-11
  Administered 2023-04-11: 160 ug via INTRAVENOUS

## 2023-04-11 MED ORDER — LIDOCAINE HCL (PF) 2 % IJ SOLN
INTRAMUSCULAR | Status: DC | PRN
  Administered 2023-04-11: 60 mg via INTRADERMAL

## 2023-04-11 MED ORDER — PROPOFOL 500 MG/50ML IV EMUL
INTRAVENOUS | Status: DC | PRN
  Administered 2023-04-11: 150 ug/kg/min via INTRAVENOUS

## 2023-04-11 MED ORDER — PROPOFOL 1000 MG/100ML IV EMUL
INTRAVENOUS | Status: AC
  Filled 2023-04-11: qty 100

## 2023-04-11 NOTE — Op Note (Signed)
 [facility] Patient Name: Frederick Alexander Procedure Date: 04/11/2023 7:08 AM MRN: [id number] Date of Birth: Nov 11, 1970 Attending MD: Hennie Duos. Marletta Lor , [place], [id number] CSN: [id number] Age: 53 Admit Type: Outpatient Procedure:                Colonoscopy Indications:              Screening for colorectal malignant neoplasm Providers:                Hennie Duos. Marletta Lor, DO, Buel Ream. Thomasena Edis RN, RN,                            Elinor Parkinson Referring MD:              Medicines:                See the Anesthesia note for documentation of the                            administered medications Complications:            No immediate complications. Estimated Blood Loss:     Estimated blood loss was minimal. Procedure:                Pre-Anesthesia Assessment:                           - The anesthesia plan was to use monitored                            anesthesia care (MAC).                           After obtaining informed consent, the colonoscope                            was passed under direct vision. Throughout the                            procedure, the patient's blood pressure, pulse, and                            oxygen saturations were monitored continuously. The                            PCF-HQ190L ([id number]) scope was introduced through                            the anus and advanced to the the cecum, identified                            by appendiceal orifice and ileocecal valve. The                            colonoscopy was performed without difficulty. The                            patient tolerated the procedure  well. The quality                            of the bowel preparation was evaluated using the                            BBPS [facility] Bowel Preparation Scale) with scores                            of: Right Colon = 3, Transverse Colon = 3 and Left                            Colon = 3 (entire mucosa seen well with no residual                             staining, small fragments of stool or opaque                            liquid). The total BBPS score equals 9. Scope In: 7:40:28 AM Scope Out: 7:59:02 AM Scope Withdrawal Time: 0 hours 12 minutes 33 seconds  Total Procedure Duration: 0 hours 18 minutes 34 seconds  Findings:      Non-bleeding internal hemorrhoids were found.      A 13 mm polyp was found in the cecum. The polyp was sessile. The polyp       was removed with a cold snare. Resection and retrieval were complete.      The exam was otherwise without abnormality. Impression:               - Non-bleeding internal hemorrhoids.                           - One 13 mm polyp in the cecum, removed with a cold                            snare. Resected and retrieved.                           - The examination was otherwise normal. Moderate Sedation:      Per Anesthesia Care Recommendation:           - Patient has a contact number available for                            emergencies. The signs and symptoms of potential                            delayed complications were discussed with the                            patient. Return to normal activities tomorrow.                            Written discharge instructions were provided to the  patient.                           - Resume previous diet.                           - Continue present medications.                           - Await pathology results.                           - Repeat colonoscopy in 3 years for surveillance.                           - Return to GI clinic in 8 weeks. Procedure Code(s):        --- Professional ---                           [phone], Colonoscopy, flexible; with removal of                            tumor(s), polyp(s), or other lesion(s) by snare                            technique Diagnosis Code(s):        --- Professional ---                           Z12.11, Encounter for screening for malignant                             neoplasm of colon                           D12.0, Benign neoplasm of cecum                           K64.8, Other hemorrhoids CPT copyright 2022 American Medical Association. All rights reserved. The codes documented in this report are preliminary and upon coder review may  be revised to meet current compliance requirements. Hennie Duos. Marletta Lor, DO Hennie Duos. Marletta Lor, DO 04/11/2023 8:05:44 AM This report has been signed electronically. Number of Addenda: 0

## 2023-04-11 NOTE — H&P (Signed)
 Primary Care Physician:  Sheela Stack Primary Gastroenterologist:  Dr. Marletta Lor  Pre-Procedure History & Physical: HPI:  Frederick Alexander is a 53 y.o. male is here for a colonoscopy for colon cancer screening purposes.   Past Medical History:  Diagnosis Date   Diabetes mellitus (HCC)    type 2   Hyperlipidemia     Past Surgical History:  Procedure Laterality Date   BIOPSY  10/16/2021   Procedure: BIOPSY;  Surgeon: Lanelle Bal, DO;  Location: [facility];  Service: Endoscopy;;   CHOLECYSTECTOMY  10/04/2016   Dr. Gabriel Cirri   ESOPHAGOGASTRODUODENOSCOPY (EGD) WITH PROPOFOL N/A 10/16/2021   Procedure: ESOPHAGOGASTRODUODENOSCOPY (EGD) WITH PROPOFOL;  Surgeon: Lanelle Bal, DO;  Location: [facility];  Service: Endoscopy;  Laterality: N/A;  9:00 am   RIGHT/LEFT HEART CATH AND CORONARY ANGIOGRAPHY N/A 03/25/2017   Procedure: RIGHT/LEFT HEART CATH AND CORONARY ANGIOGRAPHY;  Surgeon: Corky Crafts, MD;  Location: [facility] INVASIVE CV LAB;  Service: Cardiovascular;  Laterality: N/A;    Prior to Admission medications   Medication Sig Start Date End Date Taking? Authorizing Provider  ASPIRIN 81 PO Take 1 tablet by mouth daily.   Yes [provider]  cholestyramine Lanetta Inch) 4 GM/DOSE powder Take 1 packet (4 g total) by mouth daily. Mix with 4-6 oz liquid. Take other meds 1 hr before or 4-6 hr after cholestyramine. 03/09/23 09/05/23 Yes Ermalee Mealy K, DO  gabapentin (NEURONTIN) 300 MG capsule TAKE ONE CAPSULE BY MOUTH DAILY FOR 7 DAYS THE MAY increase TO 2 CAPSULES DAILY   Yes [provider]  montelukast (SINGULAIR) 10 MG tablet Take 1 tablet by mouth at bedtime.   Yes [provider]  pantoprazole (PROTONIX) 40 MG tablet Take 1 tablet (40 mg total) by mouth 2 (two) times daily before a meal. 01/02/23  Yes Clearance Coots, Kristen S, PA-C  Continuous Glucose Sensor (DEXCOM G6 SENSOR) MISC Apply topically. 11/10/22   [provider]  Continuous  Glucose Transmitter (DEXCOM G6 TRANSMITTER) MISC USE AS DIRECTED FOR 90 DAYS 11/29/22   [provider]  guaiFENesin (MUCUS RELIEF) 600 MG 12 hr tablet Take by mouth 2 (two) times daily.    [provider]  Insulin Glargine (LANTUS SOLOSTAR) 100 UNIT/ML Solostar Pen Inject 40 Units into the skin at bedtime. 01/08/17   [provider]  metFORMIN (GLUCOPHAGE-XR) 500 MG 24 hr tablet Take 1 tablet (500 mg total) by mouth 2 (two) times daily after a meal. Patient taking differently: Take 1,000 mg by mouth 2 (two) times daily after a meal. 09/20/18   Nida, Denman George, MD  naproxen sodium (ALEVE) 220 MG tablet Take 220 mg by mouth daily.     [provider]  vitamin C (ASCORBIC ACID) 250 MG tablet Take 250 mg by mouth daily.    [provider]    Allergies as of 03/17/2023 - Review Complete 03/09/2023  Allergen Reaction Noted   Prednisone Other (See Comments) 05/27/2010    Family History  Problem Relation Age of Onset   Diabetes Maternal Grandmother    Colon cancer Neg Hx    Gastric cancer Neg Hx    Esophageal cancer Neg Hx    Inflammatory bowel disease Neg Hx    Liver disease Neg Hx    Autoimmune disease Neg Hx     Social History   Socioeconomic History   Marital status: Married    Spouse name: Not on file   Number of children: Not on file  Years of education: Not on file   Highest education level: Not on file  Occupational History   Not on file  Tobacco Use   Smoking status: Never   Smokeless tobacco: Never  Vaping Use   Vaping status: Never Used  Substance and Sexual Activity   Alcohol use: Never   Drug use: Never   Sexual activity: Not on file  Other Topics Concern   Not on file  Social History Narrative   Not on file   Social Drivers of Health   Financial Resource Strain: Not on file  Food Insecurity: Not on file  Transportation Needs: Not on file  Physical Activity: Not on file  Stress: Not on file  Social  Connections: Not on file  Intimate Partner Violence: Not on file    Review of Systems: See HPI, otherwise negative ROS  Physical Exam: Vital signs in last 24 hours: Temp:  [98.5 F (36.9 C)] 98.5 F (36.9 C) (04/07 0653) Pulse Rate:  [92] 92 (04/07 0653) Resp:  [13] 13 (04/07 0653) BP: (183)/(87) 183/87 (04/07 0653) SpO2:  [100 %] 100 % (04/07 0653) Weight:  [77.1 kg] 77.1 kg (04/07 0653)   General:   Alert,  Well-developed, well-nourished, pleasant and cooperative in NAD Head:  Normocephalic and atraumatic. Eyes:  Sclera clear, no icterus.   Conjunctiva pink. Ears:  Normal auditory acuity. Nose:  No deformity, discharge,  or lesions. Msk:  Symmetrical without gross deformities. Normal posture. Extremities:  Without clubbing or edema. Neurologic:  Alert and  oriented x4;  grossly normal neurologically. Skin:  Intact without significant lesions or rashes. Psych:  Alert and cooperative. Normal mood and affect.  Impression/Plan: Frederick Alexander is here for a colonoscopy to be performed for colon cancer screening purposes.  The risks of the procedure including infection, bleed, or perforation as well as benefits, limitations, alternatives and imponderables have been reviewed with the patient. Questions have been answered. All parties agreeable.

## 2023-04-11 NOTE — Discharge Instructions (Signed)
  Colonoscopy Discharge Instructions  Read the instructions outlined below and refer to this sheet in the next few weeks. These discharge instructions provide you with general information on caring for yourself after you leave the hospital. Your doctor may also give you specific instructions. While your treatment has been planned according to the most current medical practices available, unavoidable complications occasionally occur.   ACTIVITY You may resume your regular activity, but move at a slower pace for the next 24 hours.  Take frequent rest periods for the next 24 hours.  Walking will help get rid of the air and reduce the bloated feeling in your belly (abdomen).  No driving for 24 hours (because of the medicine (anesthesia) used during the test).   Do not sign any important legal documents or operate any machinery for 24 hours (because of the anesthesia used during the test).  NUTRITION Drink plenty of fluids.  You may resume your normal diet as instructed by your doctor.  Begin with a light meal and progress to your normal diet. Heavy or fried foods are harder to digest and may make you feel sick to your stomach (nauseated).  Avoid alcoholic beverages for 24 hours or as instructed.  MEDICATIONS You may resume your normal medications unless your doctor tells you otherwise.  WHAT YOU CAN EXPECT TODAY Some feelings of bloating in the abdomen.  Passage of more gas than usual.  Spotting of blood in your stool or on the toilet paper.  IF YOU HAD POLYPS REMOVED DURING THE COLONOSCOPY: No aspirin products for 7 days or as instructed.  No alcohol for 7 days or as instructed.  Eat a soft diet for the next 24 hours.  FINDING OUT THE RESULTS OF YOUR TEST Not all test results are available during your visit. If your test results are not back during the visit, make an appointment with your caregiver to find out the results. Do not assume everything is normal if you have not heard from your  caregiver or the medical facility. It is important for you to follow up on all of your test results.  SEEK IMMEDIATE MEDICAL ATTENTION IF: You have more than a spotting of blood in your stool.  Your belly is swollen (abdominal distention).  You are nauseated or vomiting.  You have a temperature over 101.  You have abdominal pain or discomfort that is severe or gets worse throughout the day.   Your colonoscopy revealed 1 medium sized polyp(s) which I removed successfully. Await pathology results, my office will contact you. I recommend repeating colonoscopy in 3 years for surveillance purposes.   Follow up in GI office in 6-8 weeks.   I hope you have a great rest of your week!  Hennie Duos. Marletta Lor, D.O. Gastroenterology and Hepatology [facility],

## 2023-04-11 NOTE — Transfer of Care (Addendum)
 Immediate Anesthesia Transfer of Care Note  Patient: Frederick Alexander  Procedure(s) Performed: COLONOSCOPY POLYPECTOMY, INTESTINE  Patient Location: Short Stay  Anesthesia Type:General  Level of Consciousness: drowsy and patient cooperative  Airway & Oxygen Therapy: Patient Spontanous Breathing and Patient connected to nasal cannula oxygen  Post-op Assessment: Report given to RN and Post -op Vital signs reviewed and stable  Post vital signs: Reviewed and stable  Last Vitals:  Vitals Value Taken Time  BP 141/59 04/11/23   0803  Temp 36.6 04/11/23   0803  Pulse 79 04/11/23   0803  Resp 14 04/11/23   0803  SpO2 100% 04/11/23   0803    Last Pain:  Vitals:   04/11/23 0735  TempSrc:   PainSc: 0-No pain         Complications: No notable events documented.

## 2023-04-11 NOTE — Anesthesia Postprocedure Evaluation (Signed)
 Anesthesia Post Note  Patient: Frederick Alexander  Procedure(s) Performed: COLONOSCOPY POLYPECTOMY, INTESTINE  Patient location during evaluation: PACU Anesthesia Type: General Level of consciousness: awake and alert Pain management: pain level controlled Vital Signs Assessment: post-procedure vital signs reviewed and stable Respiratory status: spontaneous breathing, nonlabored ventilation, respiratory function stable and patient connected to nasal cannula oxygen Cardiovascular status: blood pressure returned to baseline and stable Postop Assessment: no apparent nausea or vomiting Anesthetic complications: no   There were no known notable events for this encounter.   Last Vitals:  Vitals:   04/11/23 0653 04/11/23 0803  BP: (!) 183/87 (!) 141/59  Pulse: 92 79  Resp: 13 14  Temp: 36.9 C 36.6 C  SpO2: 100% 100%    Last Pain:  Vitals:   04/11/23 0811  TempSrc:   PainSc: 0-No pain                 Jac Romulus L Keilynn Marano

## 2023-04-12 ENCOUNTER — Encounter (HOSPITAL_COMMUNITY): Payer: Self-pay | Admitting: Internal Medicine

## 2023-04-12 LAB — SURGICAL PATHOLOGY

## 2023-04-21 ENCOUNTER — Encounter: Payer: Self-pay | Admitting: Gastroenterology

## 2023-04-21 ENCOUNTER — Encounter: Payer: Self-pay | Admitting: *Deleted

## 2023-05-04 ENCOUNTER — Ambulatory Visit (INDEPENDENT_AMBULATORY_CARE_PROVIDER_SITE_OTHER): Admitting: Internal Medicine

## 2023-05-04 ENCOUNTER — Other Ambulatory Visit: Payer: Self-pay | Admitting: *Deleted

## 2023-05-04 ENCOUNTER — Encounter: Payer: Self-pay | Admitting: Internal Medicine

## 2023-05-04 VITALS — BP 151/80 | HR 93 | Temp 98.4°F | Ht 71.0 in | Wt 186.7 lb

## 2023-05-04 DIAGNOSIS — K760 Fatty (change of) liver, not elsewhere classified: Secondary | ICD-10-CM | POA: Diagnosis not present

## 2023-05-04 DIAGNOSIS — R1310 Dysphagia, unspecified: Secondary | ICD-10-CM | POA: Diagnosis not present

## 2023-05-04 DIAGNOSIS — R933 Abnormal findings on diagnostic imaging of other parts of digestive tract: Secondary | ICD-10-CM

## 2023-05-04 DIAGNOSIS — R7989 Other specified abnormal findings of blood chemistry: Secondary | ICD-10-CM | POA: Diagnosis not present

## 2023-05-04 DIAGNOSIS — J392 Other diseases of pharynx: Secondary | ICD-10-CM

## 2023-05-04 DIAGNOSIS — K219 Gastro-esophageal reflux disease without esophagitis: Secondary | ICD-10-CM

## 2023-05-04 DIAGNOSIS — Z860101 Personal history of adenomatous and serrated colon polyps: Secondary | ICD-10-CM

## 2023-05-04 DIAGNOSIS — K529 Noninfective gastroenteritis and colitis, unspecified: Secondary | ICD-10-CM

## 2023-05-04 DIAGNOSIS — R059 Cough, unspecified: Secondary | ICD-10-CM

## 2023-05-04 DIAGNOSIS — K9089 Other intestinal malabsorption: Secondary | ICD-10-CM

## 2023-05-04 NOTE — Progress Notes (Signed)
 Referring Provider: Lizabeth Riggs, * Primary Care Physician:  Skillman, Katherine E, PA-C Primary GI:  Dr. Mordechai April  Chief Complaint  Patient presents with   Follow-up    Patient here today for a follow up on Gerd. Patient says he has a cough and thinks may be related. Was on pantoprazole  40 mg bid, but has since stopped this as he says nothing helps.    HPI:   Frederick Alexander is a 53 y.o. male who presents to clinic today for follow-up visit.  Chronic GERD, dysphagia: Describes tickle in his throat triggered by eating or turning his head. Tried cough drops, cough medications, gargled with peroxide, but nothing helps.   Saw ENT and was started on an allergy pill and gabapentin, but this hasn't helped.   Having reflux of some "nasty" liquid 1-2 times a day.  Taking pantoprazole  twice daily.  Previously on omeprazole without relief.  Continues to have breakthrough symptoms.   Taking aleve once daily for leg pain.   EGD 10/16/2021: Gastritis biopsied, mucosal variant in the duodenum biopsied.  Recommended PPI twice daily, better controlled diabetes, ENT referral, consider CT soft tissue neck.  Surgical pathology with H. pylori gastritis.    Completed quad therapy with tetracycline , metronidazole , bismuth , pantoprazole .  Negative eradication testing 12/23/2022.  BPE 12/08/22: Esophagus: Transient obliquely oriented area of incomplete distension at the level of the high thoracic esophagus adjacent to the aortic arch is favored to be secondary to mass effect from the adjacent right brachiocephalic artery. Transient stasis of contrast at this location is ultimately cleared with additional dry swallows and ultimately adequate distention of the esophagus at this location of the visualized. No esophageal stricture, mass or ulceration.  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  Elevated LFTs: Chronic. Elevated alk phos dating back to 2014, ALT became  elevated in 2019 and AST elevated and 2022.  RUQ ultrasound with normal-appearing liver in February 2022.  No signs or symptoms of decompensated liver disease.  Has denied any history of alcohol use, OTC supplements/herbal teas, illicit drug use, Tylenol, personal or family history of autoimmune conditions, family history of liver disease.    Extensive serologic workup has been negative for Hep B and C. Hep A IgM and Hep B core IgM completed rather than total, but both were negative. IgM 240 (H), IgA and IgG within normal limits.  AMA, ANA within normal limits.  Ceruloplasmin 26.  TSH within normal limits.  Celiac screen negative.  Alpha-1 antitrypsin and ASMA negative.    Most recent labs 11/08/2022: AST 59, ALT 106, alk phos 255. Glucose 429 Creatinine 1.32 Platelets 195. Hemoglobin A1c 11.9 TSH 1.78  RUQ US  12/15/2022, status post cholecystectomy, hepatic steatosis.  Liver biopsy 04/05/23: Liver parenchyma with minimal changes, largely unremarkable.  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   Adenomatous colon polyps: Colonoscopy 04/11/2023 with 13 mm polyp in the cecum.  Pathology consistent with tubular adenoma.  Recall 3 years.  Past Medical History:  Diagnosis Date   Diabetes mellitus (HCC)    type 2   Hyperlipidemia     Past Surgical History:  Procedure Laterality Date   BIOPSY  10/16/2021   Procedure: BIOPSY;  Surgeon: Vinetta Greening, DO;  [facility];  Service: Endoscopy;;   CHOLECYSTECTOMY  10/04/2016   Dr. Perri Brake   COLONOSCOPY N/A 04/11/2023   Procedure: COLONOSCOPY;  Surgeon: Vinetta Greening, DO;  [facility];  Service: Endoscopy;  Laterality: N/A;  730AM, ASA 3   ESOPHAGOGASTRODUODENOSCOPY (EGD) WITH PROPOFOL  N/A 10/16/2021   Procedure: ESOPHAGOGASTRODUODENOSCOPY (EGD) WITH PROPOFOL ;  Surgeon: Vinetta Greening, DO;  [facility];  Service: Endoscopy;  Laterality: N/A;  9:00 am   POLYPECTOMY   04/11/2023   Procedure: POLYPECTOMY, INTESTINE;  Surgeon: Vinetta Greening, DO;  [facility];  Service: Endoscopy;;   RIGHT/LEFT HEART CATH AND CORONARY ANGIOGRAPHY N/A 03/25/2017   Procedure: RIGHT/LEFT HEART CATH AND CORONARY ANGIOGRAPHY;  Surgeon: Lucendia Rusk, MD;  Location: [facility] INVASIVE CV LAB;  Service: Cardiovascular;  Laterality: N/A;    Current Outpatient Medications  Medication Sig Dispense Refill   ASPIRIN  81 PO Take 1 tablet by mouth daily.     cholestyramine  (QUESTRAN ) 4 GM/DOSE powder Take 1 packet (4 g total) by mouth daily. Mix with 4-6 oz liquid. Take other meds 1 hr before or 4-6 hr after cholestyramine . 270 g 5   Continuous Glucose Sensor (FREESTYLE LIBRE 2 SENSOR) MISC by Does not apply route.     cyanocobalamin (VITAMIN B12) 500 MCG tablet Take 500 mcg by mouth daily.     Insulin Glargine (LANTUS SOLOSTAR) 100 UNIT/ML Solostar Pen Inject 40 Units into the skin at bedtime.     metFORMIN  (GLUCOPHAGE -XR) 500 MG 24 hr tablet Take 1 tablet (500 mg total) by mouth 2 (two) times daily after a meal. (Patient taking differently: Take 1,000 mg by mouth 2 (two) times daily after a meal.)     montelukast (SINGULAIR) 10 MG tablet Take 1 tablet by mouth at bedtime.     naproxen sodium (ALEVE) 220 MG tablet Take 220 mg by mouth daily.      vitamin C (ASCORBIC ACID) 250 MG tablet Take 250 mg by mouth daily.     gabapentin (NEURONTIN) 300 MG capsule TAKE ONE CAPSULE BY MOUTH DAILY FOR 7 DAYS THE MAY increase TO 2 CAPSULES DAILY (Patient not taking: Reported on 05/04/2023)     guaiFENesin (MUCUS RELIEF) 600 MG 12 hr tablet Take by mouth 2 (two) times daily. (Patient not taking: Reported on 05/04/2023)     pantoprazole  (PROTONIX ) 40 MG tablet Take 1 tablet (40 mg total) by mouth 2 (two) times daily before a meal. (Patient not taking: Reported on 05/04/2023) 60 tablet 3   No current facility-administered medications for this visit.    Allergies as of 05/04/2023 - Review  Complete 05/04/2023  Allergen Reaction Noted   Prednisone Other (See Comments) 05/27/2010    Family History  Problem Relation Age of Onset   Diabetes Maternal Grandmother    Colon cancer Neg Hx    Gastric cancer Neg Hx    Esophageal cancer Neg Hx    Inflammatory bowel disease Neg Hx    Liver disease Neg Hx    Autoimmune disease Neg Hx     Social History   Socioeconomic History   Marital status: Married    Spouse name: Not on file   Number of children: Not on file   Years of education: Not on file   Highest education level: Not on file  Occupational History   Not on file  Tobacco Use   Smoking status: Never   Smokeless tobacco: Never  Vaping Use   Vaping status: Never Used  Substance and Sexual Activity   Alcohol use: Never   Drug use: Never   Sexual activity: Not on file  Other Topics Concern   Not on file  Social History  Narrative   Not on file   Social Drivers of Health   Financial Resource Strain: Not on file  Food Insecurity: Not on file  Transportation Needs: Not on file  Physical Activity: Not on file  Stress: Not on file  Social Connections: Not on file    Subjective: Review of Systems  Constitutional:  Negative for chills and fever.  HENT:  Negative for congestion and hearing loss.   Eyes:  Negative for blurred vision and double vision.  Respiratory:  Negative for cough and shortness of breath.   Cardiovascular:  Negative for chest pain and palpitations.  Gastrointestinal:  Positive for diarrhea and heartburn. Negative for abdominal pain, blood in stool, constipation, melena and vomiting.  Genitourinary:  Negative for dysuria and urgency.  Musculoskeletal:  Negative for joint pain and myalgias.  Skin:  Negative for itching and rash.  Neurological:  Negative for dizziness and headaches.  Psychiatric/Behavioral:  Negative for depression. The patient is not nervous/anxious.      Objective: BP (!) 151/80 (BP Location: Left Arm, Patient Position:  Sitting, Cuff Size: Normal)   Pulse 93   Temp 98.4 F (36.9 C) (Temporal)   Ht 5\' 11"  (1.803 m)   Wt 186 lb 11.2 oz (84.7 kg)   BMI 26.04 kg/m  Physical Exam Constitutional:      Appearance: Normal appearance.  HENT:     Head: Normocephalic and atraumatic.  Eyes:     Extraocular Movements: Extraocular movements intact.     Conjunctiva/sclera: Conjunctivae normal.  Cardiovascular:     Rate and Rhythm: Normal rate and regular rhythm.  Pulmonary:     Effort: Pulmonary effort is normal.     Breath sounds: Normal breath sounds.  Abdominal:     General: Bowel sounds are normal.     Palpations: Abdomen is soft.  Musculoskeletal:        General: Normal range of motion.     Cervical back: Normal range of motion and neck supple.  Skin:    General: Skin is warm.  Neurological:     General: No focal deficit present.     Mental Status: He is alert and oriented to person, place, and time.  Psychiatric:        Mood and Affect: Mood normal.        Behavior: Behavior normal.      Assessment/Plan:  1.  Dysphagia, throat irritation, GERD-has tried numerous PPIs without relief.  Status post esophageal dilation which did not help.  Given his BPE findings as well as the CT angio, ?Dysphagia lusoria.  Patient does report this is the area of concern.  Will check on status of vascular surgery referral.  Appreciate their help immensely in this regard.  2.  Abnormal LFTs, hepatic steatosis-significant workup per HPI.  Liver biopsy unremarkable.  Likely this is due to [facility].  Needs to keep his A1c under better control.  Needs to keep his hypertension under better control.  Recommend 1-2# weight loss per week until ideal body weight through exercise & diet. Low fat/cholesterol diet.   Avoid sweets, sodas, fruit juices, sweetened beverages like tea, etc. Gradually increase exercise from 15 min daily up to 1 hr per day 5 days/week. Limit alcohol use.  Repeat LFTs in 2 to 3 months.  3.   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  Well-controlled on cholestyramine .  Will continue.  4.  Adenomatous colon polyps-colonoscopy recall 3 years.  Follow-up in 2 to 3 months.   05/04/2023 3:13  PM   Disclaimer: This note was dictated with voice recognition software. Similar sounding words can inadvertently be transcribed and may not be corrected upon review.

## 2023-05-04 NOTE — Patient Instructions (Signed)
 I am going to have Mindy work on your referral to vascular surgery.  Hopefully we get you in soon.  We have resent the referral as urgent.  Continue current medications otherwise.  Recommend you avoid tough textures. All meats should be chopped finely. Eat slowly, take small bites, chew thoroughly, and drink plenty of liquids throughout meals. If something were to get hung in your esophagus and not come up or go down, you should proceed to the emergency room.  Will recheck your liver blood tests in a couple months on follow-up visit.  It is important to keep your blood sugar under good control as best as you can.  It was very nice seeing you again today.  Dr. Mordechai April

## 2023-05-12 ENCOUNTER — Other Ambulatory Visit: Payer: Self-pay | Admitting: Gastroenterology

## 2023-05-12 DIAGNOSIS — K219 Gastro-esophageal reflux disease without esophagitis: Secondary | ICD-10-CM

## 2023-05-23 ENCOUNTER — Other Ambulatory Visit: Payer: Self-pay | Admitting: Gastroenterology

## 2023-05-23 DIAGNOSIS — K9089 Other intestinal malabsorption: Secondary | ICD-10-CM

## 2023-06-27 NOTE — Progress Notes (Unsigned)
 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 His symptoms are typical of other cases described in the literature. He has esophageal compression seen both on barium esophagram and CT angiogram. I have reviewed the images with Dr. Kerrin of cardiothoracic surgery. A combined approach would be best to revascularize the right arm and treat the compressive artery from behind the esophagus.  I will refer him to an endocrinologist to help him with difficult to control diabetes.  After he has seen Dr. Kerrin, we can plan for combined case in the near future.  CHIEF COMPLAINT: Dysphagia  HISTORY OF PRESENT ILLNESS: Frederick Alexander is a 53 y.o. male referred to clinic for evaluation of possible dysphagia disorder.  He has undergone extensive workup today.  The patient noticed occasional globus sensation in his throat.  He has frequent coughing which can be relieved by inducing vomiting.  He also has some symptoms of reflux.  He has undergone extensive workup to date.  Dr. Cindie is his gastroenterologist and identified likely dysphagia Lusardi had during his workup.  The bulk of our visit was spent reviewing his workup and explaining the surgical approach to correcting the aberrant right subclavian artery  Past Medical History:  Diagnosis Date   Diabetes mellitus (HCC)    type 2   Hyperlipidemia     Past Surgical History:  Procedure Laterality Date   BIOPSY  10/16/2021   Procedure: BIOPSY;  Surgeon: Cindie Carlin POUR, DO;  [facility];  Service: Endoscopy;;   CHOLECYSTECTOMY  10/04/2016   Dr. Ivery   COLONOSCOPY N/A 04/11/2023   Procedure: COLONOSCOPY;  Surgeon: Cindie Carlin POUR, DO;  [facility];  Service: Endoscopy;  Laterality: N/A;  730AM, ASA 3   ESOPHAGOGASTRODUODENOSCOPY (EGD) WITH PROPOFOL  N/A 10/16/2021   Procedure: ESOPHAGOGASTRODUODENOSCOPY (EGD) WITH PROPOFOL ;  Surgeon: Cindie Carlin POUR, DO;   [facility];  Service: Endoscopy;  Laterality: N/A;  9:00 am   POLYPECTOMY  04/11/2023   Procedure: POLYPECTOMY, INTESTINE;  Surgeon: Cindie Carlin POUR, DO;  [facility];  Service: Endoscopy;;   RIGHT/LEFT HEART CATH AND CORONARY ANGIOGRAPHY N/A 03/25/2017   Procedure: RIGHT/LEFT HEART CATH AND CORONARY ANGIOGRAPHY;  Surgeon: Dann Candyce RAMAN, MD;  Location: [facility] INVASIVE CV LAB;  Service: Cardiovascular;  Laterality: N/A;    Family History  Problem Relation Age of Onset   Diabetes Maternal Grandmother    Colon cancer Neg Hx    Gastric cancer Neg Hx    Esophageal cancer Neg Hx    Inflammatory bowel disease Neg Hx    Liver disease Neg Hx    Autoimmune disease Neg Hx     Social History   Socioeconomic History   Marital status: Married    Spouse name: Not on file   Number of children: Not on file   Years of education: Not on file   Highest education level: Not on file  Occupational History   Not on file  Tobacco Use   Smoking status: Never   Smokeless tobacco: Never  Vaping Use   Vaping status: Never Used  Substance and Sexual Activity   Alcohol use: Never   Drug use: Never   Sexual activity: Not on file  Other Topics Concern   Not on file  Social History Narrative   Not on file   Social Drivers of Health   Financial Resource Strain: Not on file  Food Insecurity: Not on file  Transportation  Needs: Not on file  Physical Activity: Not on file  Stress: Not on file  Social Connections: Not on file  Intimate Partner Violence: Not on file    Allergies  Allergen Reactions   Prednisone Other (See Comments)    Uratic behavior     Current Outpatient Medications  Medication Sig Dispense Refill   ASPIRIN  81 PO Take 1 tablet by mouth daily.     cholestyramine  (QUESTRAN ) 4 GM/DOSE powder Take 1 packet (4 g total) by mouth daily. Mix with 4-6 oz liquid. Take other meds 1 hr before or 4-6 hr after cholestyramine . 270 g 5   Continuous Glucose Sensor  (FREESTYLE LIBRE 2 SENSOR) MISC by Does not apply route.     cyanocobalamin (VITAMIN B12) 500 MCG tablet Take 500 mcg by mouth daily.     gabapentin (NEURONTIN) 300 MG capsule TAKE ONE CAPSULE BY MOUTH DAILY FOR 7 DAYS THE MAY increase TO 2 CAPSULES DAILY (Patient not taking: Reported on 05/04/2023)     guaiFENesin (MUCUS RELIEF) 600 MG 12 hr tablet Take by mouth 2 (two) times daily. (Patient not taking: Reported on 05/04/2023)     Insulin Glargine (LANTUS SOLOSTAR) 100 UNIT/ML Solostar Pen Inject 40 Units into the skin at bedtime.     metFORMIN  (GLUCOPHAGE -XR) 500 MG 24 hr tablet Take 1 tablet (500 mg total) by mouth 2 (two) times daily after a meal. (Patient taking differently: Take 1,000 mg by mouth 2 (two) times daily after a meal.)     montelukast (SINGULAIR) 10 MG tablet Take 1 tablet by mouth at bedtime.     naproxen sodium (ALEVE) 220 MG tablet Take 220 mg by mouth daily.      pantoprazole  (PROTONIX ) 40 MG tablet TAKE 1 TABLET BY MOUTH TWICE DAILY BEFORE a meal 60 tablet 3   vitamin C (ASCORBIC ACID) 250 MG tablet Take 250 mg by mouth daily.     No current facility-administered medications for this visit.    PHYSICAL EXAM Vitals:   06/28/23 1423  BP: 139/79  Pulse: 85  SpO2: 97%  Weight: 186 lb (84.4 kg)  Height: 5' 11 (1.803 m)    Well-appearing middle-age man in no distress Regular rate and rhythm Unlabored breathing Palpable radial pulses bilaterally   PERTINENT LABORATORY AND RADIOLOGIC DATA  Most recent CBC    Latest Ref Rng & Units 04/05/2023   11:58 AM  CBC  WBC 4.0 - 10.5 K/uL 6.7   Hemoglobin 13.0 - 17.0 g/dL 88.1   Hematocrit 60.9 - 52.0 % 33.7   Platelets 150 - 400 K/uL 198      Most recent CMP    Latest Ref Rng & Units 04/05/2023   11:58 AM 12/23/2022    1:30 PM 10/13/2021    9:22 AM  CMP  Glucose 70 - 99 mg/dL 596   759   BUN 6 - 20 mg/dL 32   22   Creatinine 9.38 - 1.24 mg/dL 8.94  8.89  9.22   Sodium 135 - 145 mmol/L 134   137   Potassium 3.5 -  5.1 mmol/L 4.3   4.2   Chloride 98 - 111 mmol/L 101   104   CO2 22 - 32 mmol/L 26   27   Calcium  8.9 - 10.3 mg/dL 8.7   8.8   Total Protein 6.5 - 8.1 g/dL 6.3     Total Bilirubin 0.0 - 1.2 mg/dL 0.3     Alkaline Phos 38 - 126 U/L 178  AST 15 - 41 U/L 53     ALT 0 - 44 U/L 73       Renal function CrCl cannot be calculated (Patient's most recent lab result is older than the maximum 21 days allowed.).  Hemoglobin A1C  Date Value  09/20/2018 10.6 % (A)  07/28/2018 11.4    LDL Cholesterol  Date Value Ref Range Status  07/28/2018 6  Final     Barium Swallow 12/08/22: area of compression seen in thoracic esophagus from right subclavian artery.  CT angiogram of chest / abdomen / pelvis 12/23/22: aberrant right subclavian artery compressing the esophagus.  Frederick Alexander. Magda, MD FACS [facility] Phone Number: [phone] 06/27/2023 5:02 PM   Total time spent on preparing this encounter including chart review, data review, collecting history, examining the patient, and coordinating care: 60 minutes  Portions of this report may have been transcribed using voice recognition software.  Every effort has been made to ensure accuracy; however, inadvertent computerized transcription errors may still be present.

## 2023-06-28 ENCOUNTER — Encounter: Payer: Self-pay | Admitting: Vascular Surgery

## 2023-06-28 ENCOUNTER — Ambulatory Visit (INDEPENDENT_AMBULATORY_CARE_PROVIDER_SITE_OTHER): Admitting: Vascular Surgery

## 2023-06-28 VITALS — BP 139/79 | HR 85 | Ht 71.0 in | Wt 186.0 lb

## 2023-06-28 DIAGNOSIS — Q278 Other specified congenital malformations of peripheral vascular system: Secondary | ICD-10-CM

## 2023-06-29 ENCOUNTER — Telehealth: Payer: Self-pay

## 2023-06-29 NOTE — Telephone Encounter (Signed)
 Per Dr Magda, patient needs to see Dr. Kerrin prior to scheduling.  Dr. Magda will plan on R carotid/subclavian transposition.

## 2023-06-30 ENCOUNTER — Encounter: Payer: Self-pay | Admitting: Internal Medicine

## 2023-07-05 LAB — HEPATIC FUNCTION PANEL: Alkaline Phosphatase: 142 — AB (ref 25–125)

## 2023-07-05 LAB — HEMOGLOBIN A1C: Hemoglobin A1C: 10.1

## 2023-07-05 LAB — LIPID PANEL
Cholesterol: 95 (ref 0–200)
HDL: 58 (ref 35–70)
LDL Cholesterol: 15
Triglycerides: 175 — AB (ref 40–160)

## 2023-07-05 LAB — COMPREHENSIVE METABOLIC PANEL WITH GFR: eGFR: 49

## 2023-07-05 LAB — BASIC METABOLIC PANEL WITH GFR
BUN: 36 — AB (ref 4–21)
Creatinine: 1.7 — AB (ref 0.6–1.3)

## 2023-07-11 ENCOUNTER — Other Ambulatory Visit: Payer: Self-pay

## 2023-07-11 NOTE — ED Provider Notes (Signed)
 ------------------------------------------------------------------------------- Attestation signed by [name], MD at 07/13/23 [phone] I was the attending physician on duty, and was available in Emergency Department for any consultations. I did not personally care for this patient. The patient was managed by the mid-level provider. I am co-signing the chart as the attending physician.   Signed by Ardeen SHAUNNA Cherie, MD July 13, 2023 at 5:54 AM  -------------------------------------------------------------------------------                                                                                     Emergency Department Provider Note    ED Clinical Impression   Final diagnoses:  Syncope, unspecified syncope type (Primary)    ED Assessment/Plan    Condition: Stable Disposition: Discharge  This chart has been completed using Dragon Medical Dictation software, and while attempts have been made to ensure accuracy, certain words and phrases may not be transcribed as intended.   History   Chief Complaint  Patient presents with  . Syncope   HPI  Frederick Alexander is a 53 y.o. male  who presents today to the  emergency department complaining of dizziness and lightheadedness x 2 months.    States he believes its related to his blood pressure.   He states at 0300 this morning he went to use the bathroom and passed out.   His mother found him and he went back to bed.   States he did strike the back of his head, likely on the toilet.  He states he took his blood pressure at home and it was in the 70's systolic.   States  his pcp put him on blood pressure medicine and thinks this is a contributor.   Currently he states he generally doesn't feel well.   Denies any pain.      Allergies: is allergic to prednisone. Medications: has a current medication list which includes the following long-term medication(s): aspirin , lantus solostar u-100 insulin, and metformin . PMHx:  has a  past medical history of Diabetes. PSHx:  has a past surgical history that includes Cholecystectomy. SocHx:  reports that he has never smoked. He has never used smokeless tobacco. He reports that he does not drink alcohol and does not use drugs. Allergies, Medications, Medical, Surgical, and Social History were reviewed as documented above.   Social Drivers of Health with Concerns   Food Insecurity: Not on file  Transportation Needs: Not on file  Alcohol Use: Not on file  Housing: Not on file  Physical Activity: Not on file  Utilities: Not on file  Stress: Not on file  Interpersonal Safety: Not on file  Substance Use: Not on file (11/08/2022)  Intimate Partner Violence: Not on file  Social Connections: Not on file  Financial Resource Strain: Not on file  Health Literacy: Not on file  Internet Connectivity: Not on file     Review Of Systems  Review of Systems  Constitutional:  Negative for chills and fever.  Respiratory:  Negative for cough and shortness of breath.   Cardiovascular:  Negative for chest pain.  Gastrointestinal:  Negative for abdominal pain, diarrhea, nausea and vomiting.  Genitourinary:  Negative for dysuria and frequency.  Musculoskeletal:  Negative for back pain, myalgias and neck pain.  Skin:  Negative for rash and wound.  Neurological:  Positive for dizziness, syncope, weakness and light-headedness. Negative for headaches.    Physical Exam   BP 159/87   Pulse 85   Temp 36.5 C (97.7 F) (Tympanic)   Resp 16   Ht 180.3 cm (5' 11)   SpO2 100%   BMI 22.45 kg/m   Physical Exam Constitutional:      Appearance: Normal appearance. He is normal weight. He is not ill-appearing or diaphoretic.  HENT:     Head: Normocephalic.     Comments: I do not note any trauma  Eyes:     Conjunctiva/sclera: Conjunctivae normal.    Cardiovascular:     Rate and Rhythm: Normal rate and regular rhythm.     Pulses: Normal pulses.     Heart sounds: Normal heart  sounds.  Pulmonary:     Effort: Pulmonary effort is normal. No respiratory distress.     Breath sounds: Normal breath sounds. No wheezing.  Abdominal:     General: Abdomen is flat. Bowel sounds are normal. There is no distension.     Palpations: Abdomen is soft.     Tenderness: There is no abdominal tenderness.   Musculoskeletal:     Cervical back: Normal range of motion and neck supple. No tenderness.   Skin:    General: Skin is warm.   Neurological:     General: No focal deficit present.     Mental Status: He is alert.   Psychiatric:        Mood and Affect: Mood normal.     ED Course  Medical Decision Making Pt presenting with what sounds like either orthostatic syncope versus vasovagal syncope.   His evaluation here is unremarkable;  he is not orthostatic;   labs/imagining all normal.   States he feels better after fluids.   Advised pt to hold his blood pressure medicine, monitor his pressures at home and call his pcp tomorrow.      Procedures   Encounter Date: 07/11/23  ECG 12 Lead  Result Value   EKG Systolic BP    EKG Diastolic BP    EKG Ventricular Rate 88   EKG Atrial Rate 88   EKG P-R Interval 160   EKG QRS Duration 140   EKG Q-T Interval 380   EKG QTC Calculation 459   EKG Calculated P Axis 61   EKG Calculated R Axis -34   EKG Calculated T Axis 31   QTC Fredericia 431   Narrative   Normal sinus rhythm Left axis deviation Right bundle branch block Abnormal ECG No previous ECGs available Confirmed by Cherie Searle ([id number]) on 07/11/2023 3:17:06 PM     ED Results Results for orders placed or performed during the hospital encounter of 07/11/23  Comprehensive Metabolic Panel  Result Value Ref Range   Sodium 134 (L) 135 - 145 mmol/L   Potassium 4.7 3.5 - 5.0 mmol/L   Chloride 101 98 - 107 mmol/L   CO2 26.1 21.0 - 32.0 mmol/L   Anion Gap 7 3 - 11 mmol/L   BUN 36 (H) 8 - 20 mg/dL   Creatinine 8.33 (H) 9.19 - 1.30 mg/dL   BUN/Creatinine Ratio 22     eGFR CKD-EPI (2021) Male 49 (L) >=60 mL/min/1.46m2   Glucose 301 (H) 70 - 179 mg/dL   Calcium  8.3 (L) 8.5 - 10.1 mg/dL   Albumin 3.5 3.5 - 5.0 g/dL  Total Protein 6.3 6.0 - 8.0 g/dL   Total Bilirubin 0.4 0.3 - 1.2 mg/dL   AST 24 15 - 40 U/L   ALT 51 12 - 78 U/L   Alkaline Phosphatase 142 (H) 46 - 116 U/L  ECG 12 Lead  Result Value Ref Range   EKG Systolic BP  mmHg   EKG Diastolic BP  mmHg   EKG Ventricular Rate 88 BPM   EKG Atrial Rate 88 BPM   EKG P-R Interval 160 ms   EKG QRS Duration 140 ms   EKG Q-T Interval 380 ms   EKG QTC Calculation 459 ms   EKG Calculated P Axis 61 degrees   EKG Calculated R Axis -34 degrees   EKG Calculated T Axis 31 degrees   QTC Fredericia 431 ms  CBC w/ Differential  Result Value Ref Range   WBC 8.0 4.0 - 10.5 10*9/L   RBC 3.66 (L) 4.10 - 5.60 10*12/L   HGB 11.3 (L) 12.5 - 17.0 g/dL   HCT 68.4 (L) 63.9 - 49.9 %   MCV 86.1 80.0 - 98.0 fL   MCH 30.9 27.0 - 34.0 pg   MCHC 35.9 32.0 - 36.0 g/dL   RDW 86.9 88.4 - 85.4 %   MPV 11.2 (H) 7.4 - 10.4 fL   Platelet 169 140 - 415 10*9/L   Neutrophils % 65.2 %   Lymphocytes % 21.9 %   Monocytes % 7.5 %   Eosinophils % 4.4 %   Basophils % 0.9 %   Absolute Neutrophils 5.2 1.8 - 7.8 10*9/L   Absolute Lymphocytes 1.7 0.7 - 4.5 10*9/L   Absolute Monocytes 0.6 0.1 - 1.0 10*9/L   Absolute Eosinophils 0.4 0.0 - 0.4 10*9/L   Absolute Basophils 0.1 0.0 - 0.2 10*9/L   ECG 12 Lead Result Date: 07/11/2023 Normal sinus rhythm Left axis deviation Right bundle branch block Abnormal ECG No previous ECGs available Confirmed by Cherie Searle ([id number]) on 07/11/2023 3:17:06 PM  CT Head Wo Contrast Result Date: 07/11/2023 Exam:  CT Head without Contrast  History:  Syncope; dizziness since starting hypertension medications.  Technique: Routine brain CT without IV contrast. AEC (automated exposure control) and/or manual techniques such as size-specific kV and mAs are employed where appropriate to reduce radiation exposure for  all CT exams.  Comparison:  Head CT dated 05/07/2019.  Findings:   BRAIN:  No CT evidence of acute infarction, hemorrhage, edema, mass or mass effect. Ventricles and basilar cisterns are unremarkable.  SOFT TISSUES:  Negative. CALVARIUM:  Negative. No fracture. SINUSES AND MASTOIDS:  No mucosal thickening or fluid.    No acute intracranial abnormality.  Signed (Electronic Signature): 07/11/2023 3:07 PM Signed By: Alyce Bellman, MD   Medications Administered:  Medications  sodium chloride  0.9% (NS) bolus 2,000 mL (2,000 mL Intravenous New Bag 07/11/23 1523)  acetaminophen (TYLENOL) tablet 650 mg (650 mg Oral Given 07/11/23 1522)    Discharge Medications (Medications Prescribed during this  ED visit and Patient's Home Medications) :    Your Medication List     ASK your doctor about these medications    ALEVE 220 MG tablet Generic drug: naproxen sodium Take 220 mg by mouth 2 (two) times a day with meals.   aspirin  81 MG tablet Commonly known as: ECOTRIN Take 81 mg by mouth daily.   LANTUS SOLOSTAR U-100 INSULIN 100 unit/mL (3 mL) injection pen Generic drug: insulin glargine INJECT 50 UNITS SUBCUTANEOUSLY DAILY   metFORMIN  500 MG tablet Commonly known as: GLUCOPHAGE   Take 500 mg by mouth 2 (two) times a day with meals.          Hunter Jeoffrey Murray, [place] 07/11/23 [phone]

## 2023-07-12 NOTE — Care Plan (Signed)
   Transitions of Care from ED   Call attempt: 1 Admission date: 07/11/23  Discharge date: 07/11/23  Discharge diagnosis: Syncope  Patient post discharge: Medications: SABRA   [facility]: [phone]:  .  [facility]: [phone]:  .  Other: Contact PCP:        [facility] TRANSITIONAL CASE MANAGEMENT SUMMARY NOTE   Attempted to contact patient today at Home to complete Transitional Case Management call from [facility]. No answer/unable to leave message; SABRA Darice LITTIE Carolee, RN

## 2023-08-16 ENCOUNTER — Encounter: Payer: Self-pay | Admitting: Thoracic Surgery (Cardiothoracic Vascular Surgery)

## 2023-08-16 ENCOUNTER — Ambulatory Visit
Attending: Thoracic Surgery (Cardiothoracic Vascular Surgery) | Admitting: Thoracic Surgery (Cardiothoracic Vascular Surgery)

## 2023-08-16 VITALS — BP 176/104 | HR 86 | Resp 18 | Ht 71.0 in | Wt 186.0 lb

## 2023-08-16 DIAGNOSIS — K229 Disease of esophagus, unspecified: Secondary | ICD-10-CM | POA: Diagnosis not present

## 2023-08-16 NOTE — Progress Notes (Signed)
 PCP is Frederick Alexander Referring Provider is Magda Debby SAILOR, MD  Chief Complaint  Patient presents with   esophageal compression    Surgical consult    HPI: 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  Frederick Alexander is a 53 year old man with a history of type 2 diabetes, hyperlipidemia, recent onset hypertension, chronic dysphagia, and an aberrant right subclavian artery.  He has been having difficulty swallowing for about 4 years.  Frequently he will swallow something and then within a few minutes regurgitated back up.  Also has a frequent cough.  Sometimes will cough so much he gets dizzy and has even passed out.  Also complains of palpitations.  At times was attributed to allergies.  He saw a gastroenterologist.  An esophagram showed compression in the upper thoracic esophagus.  CT angiogram showed an aberrant right subclavian artery.  He saw Dr. Magda from vascular surgery.  Now referred for consideration for combined procedure.  Zubrod Score: At the time of surgery this patient's most appropriate activity status/level should be described as: [x]     0    Normal activity, no symptoms []     1    Restricted in physical strenuous activity but ambulatory, able to do out light work []     2    Ambulatory and capable of self care, unable to do work activities, up and about >50 % of waking hours                              []     3    Only limited self care, in bed greater than 50% of waking hours []     4    Completely disabled, no self care, confined to bed or chair []     5    Moribund  Past Medical History:  Diagnosis Date   Diabetes mellitus (HCC)    type 2   Hyperlipidemia     Past Surgical History:  Procedure Laterality Date   BIOPSY  10/16/2021   Procedure: BIOPSY;  Surgeon: Cindie Carlin POUR, DO;  Location: [facility];  Service: Endoscopy;;   CHOLECYSTECTOMY  10/04/2016   Dr. Ivery    COLONOSCOPY N/A 04/11/2023   Procedure: COLONOSCOPY;  Surgeon: Cindie Carlin POUR, DO;  Location: [facility];  Service: Endoscopy;  Laterality: N/A;  730AM, ASA 3   ESOPHAGOGASTRODUODENOSCOPY (EGD) WITH PROPOFOL  N/A 10/16/2021   Procedure: ESOPHAGOGASTRODUODENOSCOPY (EGD) WITH PROPOFOL ;  Surgeon: Cindie Carlin POUR, DO;  Location: [facility];  Service: Endoscopy;  Laterality: N/A;  9:00 am   POLYPECTOMY  04/11/2023   Procedure: POLYPECTOMY, INTESTINE;  Surgeon: Cindie Carlin POUR, DO;  Location: [facility];  Service: Endoscopy;;   RIGHT/LEFT HEART CATH AND CORONARY ANGIOGRAPHY N/A 03/25/2017   Procedure: RIGHT/LEFT HEART CATH AND CORONARY ANGIOGRAPHY;  Surgeon: Dann Candyce RAMAN, MD;  Location: [facility] INVASIVE CV LAB;  Service: Cardiovascular;  Laterality: N/A;    Family History  Problem Relation Age of Onset   Diabetes Maternal Grandmother    Colon cancer Neg Hx    Gastric cancer Neg Hx    Esophageal cancer Neg Hx    Inflammatory bowel disease Neg Hx    Liver disease Neg Hx    Autoimmune disease Neg Hx     Social History Social History   Tobacco Use   Smoking status: Never   Smokeless tobacco: Never  Vaping  Use   Vaping status: Never Used  Substance Use Topics   Alcohol use: Never   Drug use: Never    Current Outpatient Medications  Medication Sig Dispense Refill   ASPIRIN  81 PO Take 1 tablet by mouth daily.     cholestyramine  (QUESTRAN ) 4 GM/DOSE powder Take 1 packet (4 g total) by mouth daily. Mix with 4-6 oz liquid. Take other meds 1 hr before or 4-6 hr after cholestyramine . 270 g 5   Continuous Glucose Sensor (FREESTYLE LIBRE 2 SENSOR) MISC by Does not apply route.     cyanocobalamin (VITAMIN B12) 500 MCG tablet Take 500 mcg by mouth daily.     gabapentin (NEURONTIN) 300 MG capsule TAKE ONE CAPSULE BY MOUTH DAILY FOR 7 DAYS THE MAY increase TO 2 CAPSULES DAILY     guaiFENesin (MUCUS RELIEF) 600 MG 12 hr tablet Take by mouth 2 (two) times daily.     Insulin Glargine  (LANTUS SOLOSTAR) 100 UNIT/ML Solostar Pen Inject 40 Units into the skin at bedtime.     losartan  (COZAAR ) 25 MG tablet Take 50 mg by mouth daily.     metFORMIN  (GLUCOPHAGE -XR) 500 MG 24 hr tablet Take 1 tablet (500 mg total) by mouth 2 (two) times daily after a meal. (Patient taking differently: Take 1,000 mg by mouth 2 (two) times daily after a meal.)     montelukast (SINGULAIR) 10 MG tablet Take 1 tablet by mouth at bedtime.     naproxen sodium (ALEVE) 220 MG tablet Take 220 mg by mouth daily.      pantoprazole  (PROTONIX ) 40 MG tablet TAKE 1 TABLET BY MOUTH TWICE DAILY BEFORE a meal 60 tablet 3   vitamin C (ASCORBIC ACID) 250 MG tablet Take 250 mg by mouth daily.     No current facility-administered medications for this visit.    Allergies  Allergen Reactions   Prednisone Other (See Comments)    Uratic behavior     Review of Systems  Constitutional:  Positive for fatigue. Negative for unexpected weight change.  HENT:  Positive for trouble swallowing. Negative for voice change.   Eyes:  Positive for visual disturbance (blurry).  Respiratory:  Positive for cough and shortness of breath.   Cardiovascular:  Positive for chest pain and palpitations.  Gastrointestinal:  Positive for diarrhea. Negative for abdominal pain.  Genitourinary:  Negative for difficulty urinating and dysuria.  Neurological:  Positive for dizziness, syncope and numbness.  All other systems reviewed and are negative.   BP (!) 176/104 (BP Location: Left Arm)   Pulse 86   Resp 18   Ht 5' 11 (1.803 m)   Wt 186 lb (84.4 kg)   SpO2 98%   BMI 25.94 kg/m  Physical Exam Vitals reviewed.  Constitutional:      General: He is not in acute distress.    Appearance: Normal appearance.  HENT:     Head: Normocephalic and atraumatic.  Eyes:     Extraocular Movements: Extraocular movements intact.  Cardiovascular:     Rate and Rhythm: Normal rate and regular rhythm.     Heart sounds: Normal heart sounds. No murmur  heard.    No friction rub. No gallop.  Pulmonary:     Effort: Pulmonary effort is normal. No respiratory distress.     Breath sounds: Normal breath sounds. No wheezing.  Abdominal:     General: There is no distension.     Palpations: Abdomen is soft.  Skin:    General: Skin is warm and  dry.  Neurological:     General: No focal deficit present.     Mental Status: He is alert and oriented to person, place, and time.     Cranial Nerves: No cranial nerve deficit.     Motor: No weakness.    Diagnostic Tests: ESOPHAGUS/BARIUM SWALLOW/TABLET STUDY   TECHNIQUE: Single contrast examination was performed using thin liquid barium. This exam was performed by Clotilda Hesselbach, PA-C, and was supervised and interpreted by Gordy Roulette, MD.   FLUOROSCOPY: Radiation Exposure Index (as provided by the fluoroscopic device): 39.20 mGy Kerma   COMPARISON:  None Available.   FINDINGS: Swallowing: Appears normal. No vestibular penetration or aspiration seen.   Pharynx: Unremarkable.   Esophagus: Transient obliquely oriented area of incomplete distension at the level of the high thoracic esophagus adjacent to the aortic arch is favored to be secondary to mass effect from the adjacent right brachiocephalic artery. Transient stasis of contrast at this location is ultimately cleared with additional dry swallows and ultimately adequate distention of the esophagus at this location of the visualized. No esophageal stricture, mass or ulceration.   Esophageal motility: Moderate dysmotility with multiple tertiary contractions and lack of dominant stripping peristaltic wave.   Hiatal Hernia: None.   Gastroesophageal reflux: No spontaneous gastroesophageal reflux.   Ingested 13 mm barium tablet: Passed normally   Other: Patient unable to tolerate gas crystals for double contrast study.   IMPRESSION: 1. Single contrast esophagram significant for esophageal dysmotility with multiple tertiary  contractions and lack of dominant stripping peristaltic wave. 2. No significant gastroesophageal reflux is visualized or elicited. No hiatal hernia.     Electronically Signed   By: Norleen Roulette M.D.   On: 12/08/2022 13:45 CT ANGIOGRAPHY CHEST WITH CONTRAST   TECHNIQUE: Multidetector CT imaging of the chest was performed using the standard protocol during bolus administration of intravenous contrast. Multiplanar CT image reconstructions and MIPs were obtained to evaluate the vascular anatomy.   RADIATION DOSE REDUCTION: This exam was performed according to the departmental dose-optimization program which includes automated exposure control, adjustment of the mA and/or kV according to patient size and/or use of iterative reconstruction technique.   CONTRAST:  OMNIPAQUE  IOHEXOL  350 MG/ML SOLN   COMPARISON:  None Available.   FINDINGS: Cardiovascular: Variant arch anatomy. The right and left common carotid arteries share a common origin. The right subclavian artery is aberrant and arises from the distal arch posterior to the esophagus. The aorta is normal in caliber. No dissection. No significant atherosclerotic plaque. The heart is normal in size. Unremarkable main pulmonary artery.   Mediastinum/Nodes: Unremarkable CT appearance of the thyroid gland. No suspicious mediastinal or hilar adenopathy. No soft tissue mediastinal mass. The thoracic esophagus is unremarkable.   Lungs/Pleura: Lungs are clear. No pleural effusion or pneumothorax.   Upper Abdomen: No acute abnormality.   Musculoskeletal: No chest wall abnormality. No acute or significant osseous findings.   Review of the MIP images confirms the above findings.   IMPRESSION: 1. Aberrant right subclavian artery. This is an incidental finding typically of no clinical significance. 2. No evidence of aortic aneurysm or dissection. 3. No acute cardiopulmonary process.     Electronically Signed   By: Wilkie Lent M.D.   On: 12/26/2022 09:32 I personally reviewed the CT images.  There is an aberrant right subclavian artery which courses distal to the esophagus.  No significant atherosclerotic disease.   Impression: Frederick Alexander is a 53 year old man with a history of type 2 diabetes,  hyperlipidemia, recent onset hypertension, chronic dysphagia, and an aberrant right subclavian artery.  Aberrant right subclavian artery-I strongly suspect that this is the source of his chronic dysphagia and cough.  Cannot rule out the possibility there are other factors such as esophageal dysmotility involved.  But I do think this is playing a major role if not the sole cause.  I think a lot of his other symptoms are indirectly related.  I think the best option in his case would be to do a combined procedure with a right carotid subclavian bypass and a 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.  I discussed the proposed operative procedure with Mr. and Mrs. Stegmann.  I informed him of the general nature of the robotic portion of the procedure including the need for general anesthesia, the incisions to be used, use of the surgical robot, the possible need for conversion to an open procedure, use of drains to postoperatively, the expected hospital stay, and the overall recovery.  I informed them of the indications, risks, benefits, and alternatives.  They understand the risks include, but not limited to death, MI, DVT, PE, bleeding, possible need for transfusion, possible need for conversion to open, infection, recurrent nerve injury with hoarseness, thoracic duct injury with chylothorax, esophageal injury, pain issues, as well as the possibility of other unforeseeable complications.  He accepts the risks and wishes to proceed.  Plan: Will coordinate with Dr. Magda to perform combined right carotid subclavian bypass and right VATS for ligation of aberrant right subclavian  artery.  [name], MD [facility] [phone]

## 2023-08-16 NOTE — H&P (View-Only) (Signed)
 PCP is Frederick Alexander Referring Provider is Frederick Debby SAILOR, MD  Chief Complaint  Patient presents with   esophageal compression    Surgical consult    HPI: 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  Frederick Alexander is a 53 year old man with a history of type 2 diabetes, hyperlipidemia, recent onset hypertension, chronic dysphagia, and an aberrant right subclavian artery.  He has been having difficulty swallowing for about 4 years.  Frequently he will swallow something and then within a few minutes regurgitated back up.  Also has a frequent cough.  Sometimes will cough so much he gets dizzy and has even passed out.  Also complains of palpitations.  At times was attributed to allergies.  He saw a gastroenterologist.  An esophagram showed compression in the upper thoracic esophagus.  CT angiogram showed an aberrant right subclavian artery.  He saw Dr. Magda from vascular surgery.  Now referred for consideration for combined procedure.  Frederick Alexander: At the time of surgery this patient's most appropriate activity status/level should be described as: [x]     0    Normal activity, no symptoms []     1    Restricted in physical strenuous activity but ambulatory, able to do out light work []     2    Ambulatory and capable of self care, unable to do work activities, up and about >50 % of waking hours                              []     3    Only limited self care, in bed greater than 50% of waking hours []     4    Completely disabled, no self care, confined to bed or chair []     5    Moribund  Past Medical History:  Diagnosis Date   Diabetes mellitus (HCC)    type 2   Hyperlipidemia     Past Surgical History:  Procedure Laterality Date   BIOPSY  10/16/2021   Procedure: BIOPSY;  Surgeon: Cindie Carlin POUR, DO;  Location: [facility];  Service: Endoscopy;;   CHOLECYSTECTOMY  10/04/2016   Dr. Ivery    COLONOSCOPY N/A 04/11/2023   Procedure: COLONOSCOPY;  Surgeon: Cindie Carlin POUR, DO;  Location: [facility];  Service: Endoscopy;  Laterality: N/A;  730AM, ASA 3   ESOPHAGOGASTRODUODENOSCOPY (EGD) WITH PROPOFOL  N/A 10/16/2021   Procedure: ESOPHAGOGASTRODUODENOSCOPY (EGD) WITH PROPOFOL ;  Surgeon: Cindie Carlin POUR, DO;  Location: [facility];  Service: Endoscopy;  Laterality: N/A;  9:00 am   POLYPECTOMY  04/11/2023   Procedure: POLYPECTOMY, INTESTINE;  Surgeon: Cindie Carlin POUR, DO;  Location: [facility];  Service: Endoscopy;;   RIGHT/LEFT HEART CATH AND CORONARY ANGIOGRAPHY N/A 03/25/2017   Procedure: RIGHT/LEFT HEART CATH AND CORONARY ANGIOGRAPHY;  Surgeon: Dann Candyce RAMAN, MD;  Location: [facility];  Service: Cardiovascular;  Laterality: N/A;    Family History  Problem Relation Age of Onset   Diabetes Maternal Grandmother    Colon cancer Neg Hx    Gastric cancer Neg Hx    Esophageal cancer Neg Hx    Inflammatory bowel disease Neg Hx    Liver disease Neg Hx    Autoimmune disease Neg Hx     Social History Social History   Tobacco Use   Smoking status: Never   Smokeless tobacco: Never  Vaping  Use   Vaping status: Never Used  Substance Use Topics   Alcohol use: Never   Drug use: Never    Current Outpatient Medications  Medication Sig Dispense Refill   ASPIRIN  81 PO Take 1 tablet by mouth daily.     cholestyramine  (QUESTRAN ) 4 GM/DOSE powder Take 1 packet (4 g total) by mouth daily. Mix with 4-6 oz liquid. Take other meds 1 hr before or 4-6 hr after cholestyramine . 270 g 5   Continuous Glucose Sensor (FREESTYLE LIBRE 2 SENSOR) MISC by Does not apply route.     cyanocobalamin (VITAMIN B12) 500 MCG tablet Take 500 mcg by mouth daily.     gabapentin (NEURONTIN) 300 MG capsule TAKE ONE CAPSULE BY MOUTH DAILY FOR 7 DAYS THE MAY increase TO 2 CAPSULES DAILY     guaiFENesin (MUCUS RELIEF) 600 MG 12 hr tablet Take by mouth 2 (two) times daily.     Insulin Glargine  (LANTUS SOLOSTAR) 100 UNIT/ML Solostar Pen Inject 40 Units into the skin at bedtime.     losartan  (COZAAR ) 25 MG tablet Take 50 mg by mouth daily.     metFORMIN  (GLUCOPHAGE -XR) 500 MG 24 hr tablet Take 1 tablet (500 mg total) by mouth 2 (two) times daily after a meal. (Patient taking differently: Take 1,000 mg by mouth 2 (two) times daily after a meal.)     montelukast (SINGULAIR) 10 MG tablet Take 1 tablet by mouth at bedtime.     naproxen sodium (ALEVE) 220 MG tablet Take 220 mg by mouth daily.      pantoprazole  (PROTONIX ) 40 MG tablet TAKE 1 TABLET BY MOUTH TWICE DAILY BEFORE a meal 60 tablet 3   vitamin C (ASCORBIC ACID) 250 MG tablet Take 250 mg by mouth daily.     No current facility-administered medications for this visit.    Allergies  Allergen Reactions   Prednisone Other (See Comments)    Uratic behavior     Review of Systems  Constitutional:  Positive for fatigue. Negative for unexpected weight change.  HENT:  Positive for trouble swallowing. Negative for voice change.   Eyes:  Positive for visual disturbance (blurry).  Respiratory:  Positive for cough and shortness of breath.   Cardiovascular:  Positive for chest pain and palpitations.  Gastrointestinal:  Positive for diarrhea. Negative for abdominal pain.  Genitourinary:  Negative for difficulty urinating and dysuria.  Neurological:  Positive for dizziness, syncope and numbness.  All other systems reviewed and are negative.   BP (!) 176/104 (BP Location: Left Arm)   Pulse 86   Resp 18   Ht 5' 11 (1.803 m)   Wt 186 lb (84.4 kg)   SpO2 98%   BMI 25.94 kg/m  Physical Exam Vitals reviewed.  Constitutional:      General: He is not in acute distress.    Appearance: Normal appearance.  HENT:     Head: Normocephalic and atraumatic.  Eyes:     Extraocular Movements: Extraocular movements intact.  Cardiovascular:     Rate and Rhythm: Normal rate and regular rhythm.     Heart sounds: Normal heart sounds. No murmur  heard.    No friction rub. No gallop.  Pulmonary:     Effort: Pulmonary effort is normal. No respiratory distress.     Breath sounds: Normal breath sounds. No wheezing.  Abdominal:     General: There is no distension.     Palpations: Abdomen is soft.  Skin:    General: Skin is warm and  dry.  Neurological:     General: No focal deficit present.     Mental Status: He is alert and oriented to person, place, and time.     Cranial Nerves: No cranial nerve deficit.     Motor: No weakness.    Diagnostic Tests: ESOPHAGUS/BARIUM SWALLOW/TABLET STUDY   TECHNIQUE: Single contrast examination was performed using thin liquid barium. This exam was performed by Clotilda Hesselbach, PA-C, and was supervised and interpreted by Gordy Roulette, MD.   FLUOROSCOPY: Radiation Exposure Index (as provided by the fluoroscopic device): 39.20 mGy Kerma   COMPARISON:  None Available.   FINDINGS: Swallowing: Appears normal. No vestibular penetration or aspiration seen.   Pharynx: Unremarkable.   Esophagus: Transient obliquely oriented area of incomplete distension at the level of the high thoracic esophagus adjacent to the aortic arch is favored to be secondary to mass effect from the adjacent right brachiocephalic artery. Transient stasis of contrast at this location is ultimately cleared with additional dry swallows and ultimately adequate distention of the esophagus at this location of the visualized. No esophageal stricture, mass or ulceration.   Esophageal motility: Moderate dysmotility with multiple tertiary contractions and lack of dominant stripping peristaltic wave.   Hiatal Hernia: None.   Gastroesophageal reflux: No spontaneous gastroesophageal reflux.   Ingested 13 mm barium tablet: Passed normally   Other: Patient unable to tolerate gas crystals for double contrast study.   IMPRESSION: 1. Single contrast esophagram significant for esophageal dysmotility with multiple tertiary  contractions and lack of dominant stripping peristaltic wave. 2. No significant gastroesophageal reflux is visualized or elicited. No hiatal hernia.     Electronically Signed   By: Norleen Roulette M.D.   On: 12/08/2022 13:45 CT ANGIOGRAPHY CHEST WITH CONTRAST   TECHNIQUE: Multidetector CT imaging of the chest was performed using the standard protocol during bolus administration of intravenous contrast. Multiplanar CT image reconstructions and MIPs were obtained to evaluate the vascular anatomy.   RADIATION DOSE REDUCTION: This exam was performed according to the departmental dose-optimization program which includes automated exposure control, adjustment of the mA and/or kV according to patient size and/or use of iterative reconstruction technique.   CONTRAST:  OMNIPAQUE  IOHEXOL  350 MG/ML SOLN   COMPARISON:  None Available.   FINDINGS: Cardiovascular: Variant arch anatomy. The right and left common carotid arteries share a common origin. The right subclavian artery is aberrant and arises from the distal arch posterior to the esophagus. The aorta is normal in caliber. No dissection. No significant atherosclerotic plaque. The heart is normal in size. Unremarkable main pulmonary artery.   Mediastinum/Nodes: Unremarkable CT appearance of the thyroid gland. No suspicious mediastinal or hilar adenopathy. No soft tissue mediastinal mass. The thoracic esophagus is unremarkable.   Lungs/Pleura: Lungs are clear. No pleural effusion or pneumothorax.   Upper Abdomen: No acute abnormality.   Musculoskeletal: No chest wall abnormality. No acute or significant osseous findings.   Review of the MIP images confirms the above findings.   IMPRESSION: 1. Aberrant right subclavian artery. This is an incidental finding typically of no clinical significance. 2. No evidence of aortic aneurysm or dissection. 3. No acute cardiopulmonary process.     Electronically Signed   By: Wilkie Lent M.D.   On: 12/26/2022 09:32 I personally reviewed the CT images.  There is an aberrant right subclavian artery which courses distal to the esophagus.  No significant atherosclerotic disease.   Impression: Frederick Alexander is a 53 year old man with a history of type 2 diabetes,  hyperlipidemia, recent onset hypertension, chronic dysphagia, and an aberrant right subclavian artery.  Aberrant right subclavian artery-I strongly suspect that this is the source of his chronic dysphagia and cough.  Cannot rule out the possibility there are other factors such as esophageal dysmotility involved.  But I do think this is playing a major role if not the sole cause.  I think a lot of his other symptoms are indirectly related.  I think the best option in his case would be to do a combined procedure with a right carotid subclavian bypass and a 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.  I discussed the proposed operative procedure with Mr. and Mrs. Stegmann.  I informed him of the general nature of the robotic portion of the procedure including the need for general anesthesia, the incisions to be used, use of the surgical robot, the possible need for conversion to an open procedure, use of drains to postoperatively, the expected hospital stay, and the overall recovery.  I informed them of the indications, risks, benefits, and alternatives.  They understand the risks include, but not limited to death, MI, DVT, PE, bleeding, possible need for transfusion, possible need for conversion to open, infection, recurrent nerve injury with hoarseness, thoracic duct injury with chylothorax, esophageal injury, pain issues, as well as the possibility of other unforeseeable complications.  He accepts the risks and wishes to proceed.  Plan: Will coordinate with Dr. Magda to perform combined right carotid subclavian bypass and right VATS for ligation of aberrant right subclavian  artery.  [name], MD [facility] [phone]

## 2023-08-17 ENCOUNTER — Encounter: Payer: Self-pay | Admitting: *Deleted

## 2023-08-17 ENCOUNTER — Other Ambulatory Visit: Payer: Self-pay | Admitting: *Deleted

## 2023-08-17 ENCOUNTER — Other Ambulatory Visit: Payer: Self-pay

## 2023-08-17 DIAGNOSIS — Q278 Other specified congenital malformations of peripheral vascular system: Secondary | ICD-10-CM

## 2023-08-26 ENCOUNTER — Encounter: Payer: Self-pay | Admitting: "Endocrinology

## 2023-08-26 ENCOUNTER — Ambulatory Visit: Payer: Self-pay | Admitting: "Endocrinology

## 2023-08-26 VITALS — BP 160/88 | HR 76 | Ht 71.0 in | Wt 182.2 lb

## 2023-08-26 DIAGNOSIS — Z7984 Long term (current) use of oral hypoglycemic drugs: Secondary | ICD-10-CM

## 2023-08-26 DIAGNOSIS — Z794 Long term (current) use of insulin: Secondary | ICD-10-CM | POA: Diagnosis not present

## 2023-08-26 DIAGNOSIS — E781 Pure hyperglyceridemia: Secondary | ICD-10-CM | POA: Insufficient documentation

## 2023-08-26 DIAGNOSIS — I10 Essential (primary) hypertension: Secondary | ICD-10-CM

## 2023-08-26 DIAGNOSIS — E1140 Type 2 diabetes mellitus with diabetic neuropathy, unspecified: Secondary | ICD-10-CM

## 2023-08-26 DIAGNOSIS — E785 Hyperlipidemia, unspecified: Secondary | ICD-10-CM

## 2023-08-26 MED ORDER — AMLODIPINE BESYLATE 10 MG PO TABS: 10.0000 mg | ORAL_TABLET | Freq: Every day | ORAL | 1 refills | Status: DC

## 2023-08-26 MED ORDER — EMPAGLIFLOZIN 10 MG PO TABS: 10.0000 mg | ORAL_TABLET | Freq: Every day | ORAL | 1 refills | Status: DC

## 2023-08-26 MED ORDER — LOSARTAN POTASSIUM 50 MG PO TABS: 50.0000 mg | ORAL_TABLET | Freq: Every day | ORAL | 1 refills | Status: DC

## 2023-08-26 NOTE — Progress Notes (Signed)
 Endocrinology Consult Note       08/26/2023, 10:25 AM   Subjective:    Patient ID: Frederick Alexander, male    DOB: [birth year].  Frederick Alexander is being seen in consultation for management of currently uncontrolled symptomatic diabetes requested by  Skillman, Katherine E, PA-C.   Past Medical History:  Diagnosis Date   Diabetes mellitus (HCC)    type 2   Hyperlipidemia     Past Surgical History:  Procedure Laterality Date   BIOPSY  10/16/2021   Procedure: BIOPSY;  Surgeon: Cindie Carlin POUR, DO;  Location: [facility];  Service: Endoscopy;;   CHOLECYSTECTOMY  10/04/2016   Dr. Ivery   COLONOSCOPY N/A 04/11/2023   Procedure: COLONOSCOPY;  Surgeon: Cindie Carlin POUR, DO;  Location: [facility];  Service: Endoscopy;  Laterality: N/A;  730AM, ASA 3   ESOPHAGOGASTRODUODENOSCOPY (EGD) WITH PROPOFOL  N/A 10/16/2021   Procedure: ESOPHAGOGASTRODUODENOSCOPY (EGD) WITH PROPOFOL ;  Surgeon: Cindie Carlin POUR, DO;  Location: [facility];  Service: Endoscopy;  Laterality: N/A;  9:00 am   POLYPECTOMY  04/11/2023   Procedure: POLYPECTOMY, INTESTINE;  Surgeon: Cindie Carlin POUR, DO;  Location: [facility];  Service: Endoscopy;;   RIGHT/LEFT HEART CATH AND CORONARY ANGIOGRAPHY N/A 03/25/2017   Procedure: RIGHT/LEFT HEART CATH AND CORONARY ANGIOGRAPHY;  Surgeon: Dann Candyce RAMAN, MD;  Location: [facility] INVASIVE CV LAB;  Service: Cardiovascular;  Laterality: N/A;    Social History   Socioeconomic History   Marital status: Married    Spouse name: Not on file   Number of children: Not on file   Years of education: Not on file   Highest education level: Not on file  Occupational History   Not on file  Tobacco Use   Smoking status: Never   Smokeless tobacco: Never  Vaping Use   Vaping status: Never Used  Substance and Sexual Activity   Alcohol use: Never   Drug use: Never   Sexual activity: Not on file  Other  Topics Concern   Not on file  Social History Narrative   Not on file   Social Drivers of Health   Financial Resource Strain: Not on file  Food Insecurity: Not on file  Transportation Needs: Not on file  Physical Activity: Not on file  Stress: Not on file  Social Connections: Not on file    Family History  Problem Relation Age of Onset   Diabetes Maternal Grandmother    Colon cancer Neg Hx    Gastric cancer Neg Hx    Esophageal cancer Neg Hx    Inflammatory bowel disease Neg Hx    Liver disease Neg Hx    Autoimmune disease Neg Hx     Outpatient Encounter Medications as of 08/26/2023  Medication Sig   amLODipine  (NORVASC ) 10 MG tablet Take 1 tablet (10 mg total) by mouth daily.   empagliflozin  (JARDIANCE ) 10 MG TABS tablet Take 1 tablet (10 mg total) by mouth daily before breakfast.   Insulin Glargine (BASAGLAR KWIKPEN [place]) Inject 30 Units into the skin at bedtime.   Misc Natural Products (BEET ROOT PO) Take 1 capsule by mouth daily.  Multiple Vitamins-Minerals (MULTIVITAMIN MEN 50+ PO) Take 1 tablet by mouth daily.   ASPIRIN  81 PO Take 1 tablet by mouth daily.   cholestyramine  (QUESTRAN ) 4 GM/DOSE powder Take 1 packet (4 g total) by mouth daily. Mix with 4-6 oz liquid. Take other meds 1 hr before or 4-6 hr after cholestyramine .   Continuous Glucose Sensor (FREESTYLE LIBRE 2 SENSOR) MISC by Does not apply route.   cyanocobalamin (VITAMIN B12) 500 MCG tablet Take 500 mcg by mouth daily.   gabapentin (NEURONTIN) 300 MG capsule TAKE ONE CAPSULE BY MOUTH DAILY FOR 7 DAYS THE MAY increase TO 2 CAPSULES DAILY (Patient not taking: Reported on 08/26/2023)   guaiFENesin (MUCUS RELIEF) 600 MG 12 hr tablet Take by mouth 2 (two) times daily. (Patient not taking: Reported on 08/26/2023)   losartan  (COZAAR ) 50 MG tablet Take 1 tablet (50 mg total) by mouth daily.   metFORMIN  (GLUCOPHAGE -XR) 500 MG 24 hr tablet Take 1 tablet (500 mg total) by mouth 2 (two) times daily after a meal. (Patient  taking differently: Take 1,000 mg by mouth 2 (two) times daily after a meal.)   montelukast (SINGULAIR) 10 MG tablet Take 1 tablet by mouth at bedtime. (Patient not taking: Reported on 08/26/2023)   naproxen sodium (ALEVE) 220 MG tablet Take 220 mg by mouth daily.    pantoprazole  (PROTONIX ) 40 MG tablet TAKE 1 TABLET BY MOUTH TWICE DAILY BEFORE a meal (Patient not taking: Reported on 08/26/2023)   vitamin C (ASCORBIC ACID) 250 MG tablet Take 250 mg by mouth daily.   [DISCONTINUED] Insulin Glargine (LANTUS SOLOSTAR) 100 UNIT/ML Solostar Pen Inject 40 Units into the skin at bedtime.   [DISCONTINUED] losartan  (COZAAR ) 25 MG tablet Take 50 mg by mouth daily.   No facility-administered encounter medications on file as of 08/26/2023.    ALLERGIES: Allergies  Allergen Reactions   Prednisone Other (See Comments)    Uratic behavior     VACCINATION STATUS:  There is no immunization history on file for this patient.  Diabetes He presents for his initial diabetic visit. He has type 2 diabetes mellitus. Onset time: He was diagnosed approximate age of 30 years, at which time he weighed approximately 300 pounds. His disease course has been worsening. There are no hypoglycemic associated symptoms. Pertinent negatives for hypoglycemia include no confusion, headaches, pallor or seizures. Associated symptoms include blurred vision, polydipsia and polyuria. Pertinent negatives for diabetes include no chest pain, no fatigue, no polyphagia and no weakness. Hypoglycemia complications include hospitalization. (He reports at least 1 time hospitalization for hypoglycemia.) Symptoms are worsening. Diabetic complications include peripheral neuropathy. Risk factors for coronary artery disease include diabetes mellitus, hypertension, family history and male sex. Current diabetic treatments: He is currently on Basaglar 40 units every 24 hours, metformin  1000 mg p.o. twice daily. His weight is fluctuating minimally. He is  following a generally unhealthy diet. When asked about meal planning, he reported none. He has not had a previous visit with a dietitian. He rarely participates in exercise. His breakfast blood glucose range is generally >200 mg/dl. His lunch blood glucose range is generally >200 mg/dl. His dinner blood glucose range is generally >200 mg/dl. His bedtime blood glucose range is generally >200 mg/dl. His overall blood glucose range is >200 mg/dl. (He presents with freestyle libre device showing 18% time in range, 21% level 1 hyperglycemia, 60% level 2 hyperglycemia.  He has 1% level 1 hypoglycemia.  However, patient was a lot about hypoglycemia and states that he gets tremors when  his blood glucose drops below 200 mg per DL.  His recent A1c was 10.1% in July 1st  2025 reportedly improving from 14%.  His average blood glucose is 265 mg per DL for the most recent 14 days.) An ACE inhibitor/angiotensin II receptor blocker is not being taken.  Hypertension This is a chronic problem. The current episode started more than 1 year ago. The problem is uncontrolled. Associated symptoms include blurred vision. Pertinent negatives include no chest pain, headaches, neck pain, palpitations or shortness of breath. Risk factors for coronary artery disease include diabetes mellitus, dyslipidemia and sedentary lifestyle. Past treatments include angiotensin blockers. Identifiable causes of hypertension include chronic renal disease.     Review of Systems  Constitutional:  Negative for chills, fatigue, fever and unexpected weight change.  HENT:  Negative for dental problem, mouth sores and trouble swallowing.   Eyes:  Positive for blurred vision. Negative for visual disturbance.  Respiratory:  Negative for cough, choking, chest tightness, shortness of breath and wheezing.   Cardiovascular:  Negative for chest pain, palpitations and leg swelling.  Gastrointestinal:  Negative for abdominal distention, abdominal pain,  constipation, diarrhea, nausea and vomiting.  Endocrine: Positive for polydipsia and polyuria. Negative for polyphagia.  Genitourinary:  Negative for dysuria, flank pain, hematuria and urgency.  Musculoskeletal:  Negative for back pain, gait problem, myalgias and neck pain.  Skin:  Negative for pallor, rash and wound.  Neurological:  Negative for seizures, syncope, weakness, numbness and headaches.  Psychiatric/Behavioral: Negative.  Negative for confusion and dysphoric mood.     Objective:       08/26/2023    9:16 AM 08/16/2023   10:31 AM 06/28/2023    2:23 PM  Vitals with BMI  Height 5' 11 5' 11 5' 11  Weight 182 lbs 3 oz 186 lbs 186 lbs  BMI 25.42 25.95 25.95  Systolic 160 176 860  Diastolic 88 104 79  Pulse 76 86 85    BP (!) 160/88   Pulse 76   Ht 5' 11 (1.803 m)   Wt 182 lb 3.2 oz (82.6 kg)   BMI 25.41 kg/m   Wt Readings from Last 3 Encounters:  08/26/23 182 lb 3.2 oz (82.6 kg)  08/16/23 186 lb (84.4 kg)  06/28/23 186 lb (84.4 kg)     Physical Exam Constitutional:      General: He is not in acute distress.    Appearance: He is well-developed.  HENT:     Head: Normocephalic and atraumatic.  Neck:     Thyroid: No thyromegaly.     Trachea: No tracheal deviation.  Cardiovascular:     Rate and Rhythm: Normal rate.     Pulses:          Dorsalis pedis pulses are 1+ on the right side and 1+ on the left side.       Posterior tibial pulses are 1+ on the right side and 1+ on the left side.     Heart sounds: Normal heart sounds, S1 normal and S2 normal. No murmur heard.    No gallop.  Pulmonary:     Effort: No respiratory distress.     Breath sounds: Normal breath sounds. No wheezing.  Abdominal:     General: Bowel sounds are normal. There is no distension.     Palpations: Abdomen is soft.     Tenderness: There is no abdominal tenderness. There is no guarding.  Musculoskeletal:     Right shoulder: No swelling or deformity.  Cervical back: Normal range of  motion and neck supple.  Skin:    General: Skin is warm and dry.     Findings: No rash.     Nails: There is no clubbing.  Neurological:     Mental Status: He is alert and oriented to person, place, and time.     Cranial Nerves: No cranial nerve deficit.     Sensory: No sensory deficit.     Gait: Gait normal.     Deep Tendon Reflexes: Reflexes are normal and symmetric.  Psychiatric:        Speech: Speech normal.        Behavior: Behavior normal. Behavior is cooperative.        Thought Content: Thought content normal.        Judgment: Judgment normal.     CMP ( most recent) CMP     Component Value Date/Time   NA 134 (L) 04/05/2023 1158   K 4.3 04/05/2023 1158   CL 101 04/05/2023 1158   CO2 26 04/05/2023 1158   GLUCOSE 403 (H) 04/05/2023 1158   BUN 32 (H) 04/05/2023 1158   BUN 25 (A) 07/28/2018 0000   CREATININE 1.05 04/05/2023 1158   CALCIUM  8.7 (L) 04/05/2023 1158   PROT 6.3 (L) 04/05/2023 1158   ALBUMIN 3.5 04/05/2023 1158   AST 53 (H) 04/05/2023 1158   ALT 73 (H) 04/05/2023 1158   ALKPHOS 178 (H) 04/05/2023 1158   BILITOT 0.3 04/05/2023 1158   GFRNONAA >60 04/05/2023 1158     Diabetic Labs (most recent): Lab Results  Component Value Date   HGBA1C 10.6 (A) 09/20/2018   HGBA1C 11.4 07/28/2018   HGBA1C 12.3 10/01/2009     Lipid Panel ( most recent) Lipid Panel     Component Value Date/Time   CHOL 76 07/28/2018 0000   TRIG 91 07/28/2018 0000   HDL 52 07/28/2018 0000   LDLCALC 6 07/28/2018 0000       Assessment & Plan:   1. Poorly controlled type 2 diabetes mellitus with neuropathy (HCC) (Primary)  - Frederick Alexander has currently uncontrolled symptomatic type 2 DM since  53 years of age.  He presents with freestyle libre device showing 18% time in range, 21% level 1 hyperglycemia, 60% level 2 hyperglycemia.  He has 1% level 1 hypoglycemia.  However, patient worries a lot about hypoglycemia and states that he gets tremors when his blood glucose drops below  200 mg per DL.  His recent A1c was 10.1% in July 1st  2025 reportedly improving from 14%.  His average blood glucose is 265 mg per DL for the most recent 14 days.   -Recent labs reviewed. - I had a long discussion with him about the possible risk factors and  the pathology behind diabetes and its complications. -his diabetes is complicated by neuropathy, comorbid hypertension, hypertriglyceridemia and he remains at exceedingly high risk for more acute and chronic complications which include CAD, CVA, CKD, retinopathy, and neuropathy. These are all discussed in detail with him.  - I discussed all available options of managing his diabetes including de-escalation of medications. I have counseled him on Food as Medicine by adopting a Whole Food , Plant Predominant  ( WFPP) nutrition as recommended by [employer] of Lifestyle Medicine. Patient is encouraged to switch to  unprocessed or minimally processed  complex starch, adequate protein intake (mainly plant source), minimal liquid fat, plenty of fruits, and vegetables. -  he is advised to stick to a  routine mealtimes to eat 3 complete meals a day and snack only when necessary (to snack only to correct hypoglycemia BG <70 day time or <100 at night).   - he acknowledges that there is a room for improvement in his food and drink choices. - Further Specific Suggestion is made for him to avoid simple carbohydrates  from his diet including Cakes, Sweet Desserts, Ice Cream, Soda (diet and regular), Sweet Tea, Candies, Chips, Cookies, Store Bought Juices, Alcohol ,  Artificial Sweeteners,  Coffee Creamer, and Sugar-free Products. This will help patient to have more stable blood glucose profile and potentially avoid unintended weight gain.    - he will be scheduled with Penny Crumpton, RDN, CDE for individualized diabetes education.  - I have approached him with the following individualized plan to manage  his diabetes and patient agrees:   - This  patient may need multiple daily injections of insulin to treat his diabetes to target, however he will be given a chance to delay that by optimizing his non-insulin treatment options.    - He worries about hypoglycemia, approached him to lower his Basaglar to 30 units nightly instead of randomly, associated with continuous monitoring of blood glucose using his CGM device.   - he is encouraged to call clinic for blood glucose levels less than 70 or above 200 mg per DL weekly average.   - He will benefit from introduction of SGLT2 inhibitor-discussed and initiated Jardiance  10 mg p.o. daily at breakfast.  This medication will be advanced to the next higher dose as he tolerates.  Side effects and precautions discussed with him.   -He is advised to continue metformin  1000 mg p.o. twice daily. - Specific targets for  A1c;  LDL, HDL,  and Triglycerides were discussed with the patient.  2) Blood Pressure /Hypertension:  his blood pressure is uncontrolled to target.   he is advised to continue his current medications including losartan  50 mg p.o. daily at breakfast, discussed and added amlodipine  10 mg p.o. daily at breakfast.    3) Lipids/Hyperlipidemia:   Review of his recent lipid panel showed uncontrolled triglycerides at 175, however low LDL of 15 , total cholesterol 95, HDL 58.  Not clear if he has syndromic dyslipidemia at this time, however he is advised to stay on his cholestyramine .  He is not on statins for now.   4)  Weight/Diet:  Body mass index is 25.41 kg/m.  -    he is not a candidate for weight loss. I discussed with him the fact that loss of 5 - 10% of his  current body weight will have the most impact on his diabetes management.  The above detailed  ACLM recommendations for nutrition, exercise, sleep, social life, avoidance of risky substances, the need for restorative sleep  information is also detailed on discharge instructions.  5) Chronic Care/Health Maintenance:  -he  is on ARB   medications and  is encouraged to initiate and continue to follow up with Ophthalmology, Dentist,  Podiatrist at least yearly or according to recommendations, and advised to   stay away from smoking. I have recommended yearly flu vaccine and pneumonia vaccine at least every 5 years; moderate intensity exercise for up to 150 minutes weekly; and  sleep for 7- 9 hours a day.  - he is  advised to maintain close follow up with Skillman, Katherine E, PA-C for primary care needs, as well as his other providers for optimal and coordinated care.   Thank you  for involving me in the care of this pleasant patient.  I spent  62  minutes in the care of the patient today including review of labs from CMP, Lipids, Thyroid Function, Hematology (current and previous including abstractions from other facilities); face-to-face time discussing  his blood glucose readings/logs, discussing hypoglycemia and hyperglycemia episodes and symptoms, medications doses, his options of short and long term treatment based on the latest standards of care / guidelines;  discussion about incorporating lifestyle medicine;  and documenting the encounter. Risk reduction counseling performed per USPSTF guidelines to reduce cardiovascular risk factors.     Please refer to Patient Instructions for Blood Glucose Monitoring and Insulin/Medications Dosing Guide  in media tab for additional information. Please  also refer to  Patient Self Inventory in the Media  tab for reviewed elements of pertinent patient history.  Frederick Alexander participated in the discussions, expressed understanding, and voiced agreement with the above plans.  All questions were answered to his satisfaction. he is encouraged to contact clinic should he have any questions or concerns prior to his return visit.   Follow up plan: - Return in about 2 weeks (around 09/09/2023) for F/U with Meter/CGM Jonnie Only - no Labs.  Frederick Earl, MD [facility] [address] [place], [place] [address] Phone: [phone]  Fax: [phone]    08/26/2023, 10:25 AM  This note was partially dictated with voice recognition software. Similar sounding words can be transcribed inadequately or may not  be corrected upon review.

## 2023-08-26 NOTE — Patient Instructions (Signed)

## 2023-08-29 ENCOUNTER — Telehealth: Payer: Self-pay

## 2023-08-29 NOTE — Telephone Encounter (Signed)
 Received a message stating pt has experienced hypoglycemic episode through the night. Stated he has been trying to take his Basaglar between 8-9pm. Stated he has also been experiencing vomiting for the past 2 days. Uploaded & printed pt's Libre CGM data. Tried to return call to the pt but did not receive an answer, left a message requesting pt return call to the office. Pt's CGM data given to Dr.Nida.

## 2023-08-29 NOTE — Telephone Encounter (Signed)
 Spoke with pt he stated he did not take any of his insulin last night. States he has started taking Jardiance  10mg  daily, continues taking Metformin  1000mg  bid. Pt also stated he is taking Amlodipine  10mg  daily for BP along with Losartan  50mg  daily. States he has been experieincing dizziness and feels like his head is spinning at times, BP 107/76. Pt states for the past 2-3 days approximately 30 minutes after eating, especially breakfast, he has to vomit. Discussed pt symptoms with Dr.Nida. Dr.Nida evaluated pt's CGM data. Advised pt to decrease Basaglar to 20 units daily and to inject insulin at breakfast, continue Jardiance  10mg  daily and Metformin  1000mg  bid and to reduce his Losartan  to 25mg  daily and continue Amlodipine  10mg  daily per Dr. Barbette orders. Pt voiced understanding.

## 2023-09-09 NOTE — Pre-Procedure Instructions (Signed)
 Surgical Instructions   Your procedure is scheduled on September 14, 2023. Report to [facility] Main Entrance A at 6:30 A.M., then check in with the Admitting office. Any questions or running late day of surgery: call [phone]  Questions prior to your surgery date: call [phone], Monday-Friday, 8am-4pm. If you experience any cold or flu symptoms such as cough, fever, chills, shortness of breath, etc. between now and your scheduled surgery, please notify us  at the above number.     Remember:  Do not eat or drink after midnight the night before your surgery. No mints, gum or hard candy.     Take these medicines the morning of surgery with A SIP OF WATER: amLODipine  (NORVASC )    May take these medicines IF NEEDED:  Follow your surgeon's instructions on stopping Aspirin . If no instructions were given, please contact your surgeon's office.   One week prior to surgery, STOP taking any  Aleve, Naproxen, Ibuprofen, Motrin, Advil, Goody's, BC's, all herbal medications, fish oil, and non-prescription vitamins.           WHAT DO I DO ABOUT MY DIABETES MEDICATION?   Do not take oral diabetes medicines (pills) the morning of surgery. DO NOT TAKE empagliflozin  (JARDIANCE ) or metFORMIN  (GLUCOPHAGE -XR)  the day of surgery.  Per your surgeon's instructions- stop taking Merformin 2 days prior to your surgery. (Do not take after 09/11/23)  Do not take Jardiance  for 72 hours prior to surgery. (Do not take after 09/10/23)  THE NIGHT BEFORE SURGERY, take 10 units of Insulin Glargine (BASAGLAR KWIKPEN [place]) (Half dose)      HOW TO MANAGE YOUR DIABETES BEFORE AND AFTER SURGERY  Why is it important to control my blood sugar before and after surgery? Improving blood sugar levels before and after surgery helps healing and can limit problems. A way of improving blood sugar control is eating a healthy diet by:  Eating less sugar and carbohydrates  Increasing activity/exercise  Talking with  your doctor about reaching your blood sugar goals High blood sugars (greater than 180 mg/dL) can raise your risk of infections and slow your recovery, so you will need to focus on controlling your diabetes during the weeks before surgery. Make sure that the doctor who takes care of your diabetes knows about your planned surgery including the date and location.  How do I manage my blood sugar before surgery? Check your blood sugar at least 4 times a day, starting 2 days before surgery, to make sure that the level is not too high or low.  Check your blood sugar the morning of your surgery when you wake up and every 2 hours until you get to the Short Stay unit.  If your blood sugar is less than 70 mg/dL, you will need to treat for low blood sugar: Do not take insulin. Treat a low blood sugar (less than 70 mg/dL) with  cup of clear juice (cranberry or apple), 4 glucose tablets, OR glucose gel. Recheck blood sugar in 15 minutes after treatment (to make sure it is greater than 70 mg/dL). If your blood sugar is not greater than 70 mg/dL on recheck, call [phone] for further instructions. Report your blood sugar to the short stay nurse when you get to Short Stay.  If you are admitted to the hospital after surgery: Your blood sugar will be checked by the staff and you will probably be given insulin after surgery (instead of oral diabetes medicines) to make sure you have good blood sugar levels.  The goal for blood sugar control after surgery is 80-180 mg/dL.           Do NOT Smoke (Tobacco/Vaping) for 24 hours prior to your procedure.  If you use a CPAP at night, you may bring your mask/headgear for your overnight stay.   You will be asked to remove any contacts, glasses, piercing's, hearing aid's, dentures/partials prior to surgery. Please bring cases for these items if needed.    Patients discharged the day of surgery will not be allowed to drive home, and someone needs to stay with them for 24  hours.  SURGICAL WAITING ROOM VISITATION Patients may have no more than 2 support people in the waiting area - these visitors may rotate.   Pre-op nurse will coordinate an appropriate time for 1 ADULT support person, who may not rotate, to accompany patient in pre-op.  Children under the age of 48 must have an adult with them who is not the patient and must remain in the main waiting area with an adult.  If the patient needs to stay at the hospital during part of their recovery, the visitor guidelines for inpatient rooms apply.  Please refer to the [facility] website for the visitor guidelines for any additional information.   If you received a COVID test during your pre-op visit  it is requested that you wear a mask when out in public, stay away from anyone that may not be feeling well and notify your surgeon if you develop symptoms. If you have been in contact with anyone that has tested positive in the last 10 days please notify you surgeon.      Pre-operative CHG Bathing Instructions   You can play a key role in reducing the risk of infection after surgery. Your skin needs to be as free of germs as possible. You can reduce the number of germs on your skin by washing with CHG (chlorhexidine gluconate) soap before surgery. CHG is an antiseptic soap that kills germs and continues to kill germs even after washing.   DO NOT use if you have an allergy to chlorhexidine/CHG or antibacterial soaps. If your skin becomes reddened or irritated, stop using the CHG and notify one of our RNs at [phone].              TAKE A SHOWER THE NIGHT BEFORE SURGERY AND THE DAY OF SURGERY    Please keep in mind the following:  DO NOT shave, including legs and underarms, 48 hours prior to surgery.   You may shave your face before/day of surgery.  Place clean sheets on your bed the night before surgery Use a clean washcloth (not used since being washed) for each shower. DO NOT sleep with pet's night  before surgery.  CHG Shower Instructions:  Wash your face and private area with normal soap. If you choose to wash your hair, wash first with your normal shampoo.  After you use shampoo/soap, rinse your hair and body thoroughly to remove shampoo/soap residue.  Turn the water OFF and apply half the bottle of CHG soap to a CLEAN washcloth.  Apply CHG soap ONLY FROM YOUR NECK DOWN TO YOUR TOES (washing for 3-5 minutes)  DO NOT use CHG soap on face, private areas, open wounds, or sores.  Pay special attention to the area where your surgery is being performed.  If you are having back surgery, having someone wash your back for you may be helpful. Wait 2 minutes after CHG soap is applied,  then you may rinse off the CHG soap.  Pat dry with a clean towel  Put on clean pajamas    Additional instructions for the day of surgery: DO NOT APPLY any lotions, deodorants, cologne, or perfumes.   Do not wear jewelry or makeup Do not wear nail polish, gel polish, artificial nails, or any other type of covering on natural nails (fingers and toes) Do not bring valuables to the hospital. [facility] is not responsible for valuables/personal belongings. Put on clean/comfortable clothes.  Please brush your teeth.  Ask your nurse before applying any prescription medications to the skin.

## 2023-09-11 ENCOUNTER — Other Ambulatory Visit: Payer: Self-pay | Admitting: Internal Medicine

## 2023-09-11 DIAGNOSIS — K219 Gastro-esophageal reflux disease without esophagitis: Secondary | ICD-10-CM

## 2023-09-12 ENCOUNTER — Ambulatory Visit (HOSPITAL_COMMUNITY)
Admission: RE | Admit: 2023-09-12 | Discharge: 2023-09-12 | Disposition: A | Discharge status: Home / Self Care | Source: Ambulatory Visit | Attending: Thoracic Surgery (Cardiothoracic Vascular Surgery) | Admitting: Thoracic Surgery (Cardiothoracic Vascular Surgery)

## 2023-09-12 ENCOUNTER — Other Ambulatory Visit: Payer: Self-pay

## 2023-09-12 ENCOUNTER — Encounter (HOSPITAL_COMMUNITY)
Admission: RE | Admit: 2023-09-12 | Discharge: 2023-09-12 | Disposition: A | Discharge status: Home / Self Care | Source: Ambulatory Visit | Attending: Thoracic Surgery (Cardiothoracic Vascular Surgery) | Admitting: Thoracic Surgery (Cardiothoracic Vascular Surgery)

## 2023-09-12 ENCOUNTER — Encounter (HOSPITAL_COMMUNITY): Payer: Self-pay

## 2023-09-12 VITALS — BP 168/93 | HR 87 | Temp 98.3°F | Resp 20 | Ht 71.0 in | Wt 182.8 lb

## 2023-09-12 DIAGNOSIS — Z01818 Encounter for other preprocedural examination: Secondary | ICD-10-CM

## 2023-09-12 DIAGNOSIS — Q278 Other specified congenital malformations of peripheral vascular system: Secondary | ICD-10-CM | POA: Insufficient documentation

## 2023-09-12 HISTORY — DX: Nausea with vomiting, unspecified: R11.2

## 2023-09-12 HISTORY — DX: Essential (primary) hypertension: I10

## 2023-09-12 HISTORY — DX: Other specified postprocedural states: Z98.890

## 2023-09-12 LAB — COMPREHENSIVE METABOLIC PANEL WITH GFR
ALT: 62 U/L — ABNORMAL HIGH (ref 0–44)
AST: 46 U/L — ABNORMAL HIGH (ref 15–41)
Albumin: 4 g/dL (ref 3.5–5.0)
Alkaline Phosphatase: 130 U/L — ABNORMAL HIGH (ref 38–126)
Anion gap: 10 (ref 5–15)
BUN: 29 mg/dL — ABNORMAL HIGH (ref 6–20)
CO2: 26 mmol/L (ref 22–32)
Calcium: 9.3 mg/dL (ref 8.9–10.3)
Chloride: 102 mmol/L (ref 98–111)
Creatinine, Ser: 1.19 mg/dL (ref 0.61–1.24)
GFR, Estimated: >60 mL/min (ref >60)
Glucose, Bld: 145 mg/dL — ABNORMAL HIGH (ref 70–99)
Potassium: 4.3 mmol/L (ref 3.5–5.1)
Sodium: 138 mmol/L (ref 135–145)
Total Bilirubin: 0.8 mg/dL (ref 0.0–1.2)
Total Protein: 7 g/dL (ref 6.5–8.1)

## 2023-09-12 LAB — URINALYSIS, ROUTINE W REFLEX MICROSCOPIC
Bacteria, UA: NONE SEEN
Bilirubin Urine: NEGATIVE
Glucose, UA: >=500 mg/dL — AB
Hgb urine dipstick: NEGATIVE
Ketones, ur: NEGATIVE mg/dL
Leukocytes,Ua: NEGATIVE
Nitrite: NEGATIVE
Protein, ur: 30 mg/dL — AB
Specific Gravity, Urine: 1.011 (ref 1.005–1.030)
pH: 6 (ref 5.0–8.0)

## 2023-09-12 LAB — GLUCOSE, CAPILLARY: Glucose-Capillary: 183 mg/dL — ABNORMAL HIGH (ref 70–99)

## 2023-09-12 LAB — CBC
HCT: 36 % — ABNORMAL LOW (ref 39.0–52.0)
Hemoglobin: 12.6 g/dL — ABNORMAL LOW (ref 13.0–17.0)
MCH: 30.9 pg (ref 26.0–34.0)
MCHC: 35 g/dL (ref 30.0–36.0)
MCV: 88.2 fL (ref 80.0–100.0)
Platelets: 213 K/uL (ref 150–400)
RBC: 4.08 MIL/uL — ABNORMAL LOW (ref 4.22–5.81)
RDW: 13.5 % (ref 11.5–15.5)
WBC: 7.6 K/uL (ref 4.0–10.5)
nRBC: 0 % (ref 0.0–0.2)

## 2023-09-12 LAB — SURGICAL PCR SCREEN
MRSA, PCR: NEGATIVE
Staphylococcus aureus: NEGATIVE

## 2023-09-12 LAB — APTT: aPTT: 30 s (ref 24–36)

## 2023-09-12 NOTE — Progress Notes (Signed)
 PCP - Comer Raymond Cardiologist - Denies  PPM/ICD - Denies Device Orders - n/a Rep Notified - n/a  Chest x-ray - 09-12-23 EKG - 09-12-23 Stress Test - 03-04-17 ECHO - 03-03-17 Cardiac Cath - 03-25-17  Sleep Study - Denies CPAP - n/a  Fasting Blood Sugar - 100-200 Checks Blood Sugar. Pt wears Libre on left arm  Last dose of GLP1 agonist-  Denies GLP1 instructions: n/a  Blood Thinner Instructions: Denies Aspirin  Instructions: Per patient not prescribed but takes daily; Last dose he took was on 09-11-23  ERAS Protcol - NPO PRE-SURGERY Ensure or G2- n/a  COVID TEST- n/a   Anesthesia review: Yes, DM, HTN  Patient denies shortness of breath, fever, cough and chest pain at PAT appointment. Patient denies any respiratory issues at this time.    All instructions explained to the patient, with a verbal understanding of the material. Patient agrees to go over the instructions while at home for a better understanding. Patient also instructed to self quarantine after being tested for COVID-19. The opportunity to ask questions was provided.

## 2023-09-12 NOTE — Progress Notes (Signed)
 Surgical Instructions     Your procedure is scheduled on September 14, 2023. Report to [facility] Main Entrance A at 6:30 A.M., then check in with the Admitting office. Any questions or running late day of surgery: call [phone]   Questions prior to your surgery date: call [phone], Monday-Friday, 8am-4pm. If you experience any cold or flu symptoms such as cough, fever, chills, shortness of breath, etc. between now and your scheduled surgery, please notify us  at the above number.            Remember:       Do not eat or drink after midnight the night before your surgery. No mints, gum or hard candy.            Take these medicines the morning of surgery with A SIP OF WATER: amLODipine  (NORVASC )      May take these medicines IF NEEDED:   Follow your surgeon's instructions on stopping Aspirin . If no instructions were given, please contact your surgeon's office.    One week prior to surgery, STOP taking any  Aleve, Naproxen, Ibuprofen, Motrin, Advil, Goody's, BC's, all herbal medications, fish oil, and non-prescription vitamins.           WHAT DO I DO ABOUT MY DIABETES MEDICATION?     Do not take oral diabetes medicines (pills) the morning of surgery. DO NOT TAKE empagliflozin  (JARDIANCE ) or metFORMIN  (GLUCOPHAGE -XR)  the day of surgery.   Per your surgeon's instructions- stop taking Merformin 2 days prior to your surgery. (Do not take after 09/11/23)   Do not take Jardiance  for 72 hours prior to surgery. (Do not take after 09/10/23)   THE MORNING BEFORE SURGERY, take 20 units of Insulin  Glargine (BASAGLAR  KWIKPEN [address]).            HOW TO MANAGE YOUR DIABETES BEFORE AND AFTER SURGERY   Why is it important to control my blood sugar before and after surgery? Improving blood sugar levels before and after surgery helps healing and can limit problems. A way of improving blood sugar control is eating a healthy diet by:  Eating less sugar and carbohydrates  Increasing  activity/exercise  Talking with your doctor about reaching your blood sugar goals High blood sugars (greater than 180 mg/dL) can raise your risk of infections and slow your recovery, so you will need to focus on controlling your diabetes during the weeks before surgery. Make sure that the doctor who takes care of your diabetes knows about your planned surgery including the date and location.   How do I manage my blood sugar before surgery? Check your blood sugar at least 4 times a day, starting 2 days before surgery, to make sure that the level is not too high or low.   Check your blood sugar the morning of your surgery when you wake up and every 2 hours until you get to the Short Stay unit.   If your blood sugar is less than 70 mg/dL, you will need to treat for low blood sugar: Do not take insulin . Treat a low blood sugar (less than 70 mg/dL) with  cup of clear juice (cranberry or apple), 4 glucose tablets, OR glucose gel. Recheck blood sugar in 15 minutes after treatment (to make sure it is greater than 70 mg/dL). If your blood sugar is not greater than 70 mg/dL on recheck, call [phone] for further instructions. Report your blood sugar to the short stay nurse when you get to Short Stay.   If  you are admitted to the hospital after surgery: Your blood sugar will be checked by the staff and you will probably be given insulin  after surgery (instead of oral diabetes medicines) to make sure you have good blood sugar levels. The goal for blood sugar control after surgery is 80-180 mg/dL.           Do NOT Smoke (Tobacco/Vaping) for 24 hours prior to your procedure.   If you use a CPAP at night, you may bring your mask/headgear for your overnight stay.   You will be asked to remove any contacts, glasses, piercing's, hearing aid's, dentures/partials prior to surgery. Please bring cases for these items if needed.    Patients discharged the day of surgery will not be allowed to drive home, and  someone needs to stay with them for 24 hours.   SURGICAL WAITING ROOM VISITATION Patients may have no more than 2 support people in the waiting area - these visitors may rotate.   Pre-op nurse will coordinate an appropriate time for 1 ADULT support person, who may not rotate, to accompany patient in pre-op.  Children under the age of 61 must have an adult with them who is not the patient and must remain in the main waiting area with an adult.   If the patient needs to stay at the hospital during part of their recovery, the visitor guidelines for inpatient rooms apply.   Please refer to the [facility] website for the visitor guidelines for any additional information.     If you received a COVID test during your pre-op visit  it is requested that you wear a mask when out in public, stay away from anyone that may not be feeling well and notify your surgeon if you develop symptoms. If you have been in contact with anyone that has tested positive in the last 10 days please notify you surgeon.         Pre-operative CHG Bathing Instructions    You can play a key role in reducing the risk of infection after surgery. Your skin needs to be as free of germs as possible. You can reduce the number of germs on your skin by washing with CHG (chlorhexidine  gluconate) soap before surgery. CHG is an antiseptic soap that kills germs and continues to kill germs even after washing.    DO NOT use if you have an allergy to chlorhexidine /CHG or antibacterial soaps. If your skin becomes reddened or irritated, stop using the CHG and notify one of our RNs at [phone].               TAKE A SHOWER THE NIGHT BEFORE SURGERY AND THE DAY OF SURGERY     Please keep in mind the following:  DO NOT shave, including legs and underarms, 48 hours prior to surgery.   You may shave your face before/day of surgery.  Place clean sheets on your bed the night before surgery Use a clean washcloth (not used since being washed)  for each shower. DO NOT sleep with pet's night before surgery.   CHG Shower Instructions:  Wash your face and private area with normal soap. If you choose to wash your hair, wash first with your normal shampoo.  After you use shampoo/soap, rinse your hair and body thoroughly to remove shampoo/soap residue.  Turn the water OFF and apply half the bottle of CHG soap to a CLEAN washcloth.  Apply CHG soap ONLY FROM YOUR NECK DOWN TO YOUR TOES (washing for  3-5 minutes)  DO NOT use CHG soap on face, private areas, open wounds, or sores.  Pay special attention to the area where your surgery is being performed.  If you are having back surgery, having someone wash your back for you may be helpful. Wait 2 minutes after CHG soap is applied, then you may rinse off the CHG soap.  Pat dry with a clean towel  Put on clean pajamas     Additional instructions for the day of surgery: DO NOT APPLY any lotions, deodorants, cologne, or perfumes.   Do not wear jewelry or makeup Do not wear nail polish, gel polish, artificial nails, or any other type of covering on natural nails (fingers and toes) Do not bring valuables to the hospital. [facility] is not responsible for valuables/personal belongings. Put on clean/comfortable clothes.  Please brush your teeth.  Ask your nurse before applying any prescription medications to the skin.

## 2023-09-13 ENCOUNTER — Ambulatory Visit (INDEPENDENT_AMBULATORY_CARE_PROVIDER_SITE_OTHER): Admitting: "Endocrinology

## 2023-09-13 ENCOUNTER — Encounter: Payer: Self-pay | Admitting: "Endocrinology

## 2023-09-13 VITALS — BP 156/86 | HR 96 | Ht 71.0 in | Wt 181.4 lb

## 2023-09-13 DIAGNOSIS — E781 Pure hyperglyceridemia: Secondary | ICD-10-CM | POA: Diagnosis not present

## 2023-09-13 DIAGNOSIS — Z794 Long term (current) use of insulin: Secondary | ICD-10-CM

## 2023-09-13 DIAGNOSIS — E1140 Type 2 diabetes mellitus with diabetic neuropathy, unspecified: Secondary | ICD-10-CM

## 2023-09-13 DIAGNOSIS — Z7984 Long term (current) use of oral hypoglycemic drugs: Secondary | ICD-10-CM

## 2023-09-13 DIAGNOSIS — I10 Essential (primary) hypertension: Secondary | ICD-10-CM

## 2023-09-13 DIAGNOSIS — E1165 Type 2 diabetes mellitus with hyperglycemia: Secondary | ICD-10-CM

## 2023-09-13 DIAGNOSIS — R748 Abnormal levels of other serum enzymes: Secondary | ICD-10-CM | POA: Insufficient documentation

## 2023-09-13 MED ORDER — EMPAGLIFLOZIN 25 MG PO TABS: 25.0000 mg | ORAL_TABLET | Freq: Every day | ORAL | 1 refills | Status: AC

## 2023-09-13 MED ORDER — METFORMIN HCL ER 500 MG PO TB24: 500.0000 mg | ORAL_TABLET | Freq: Every day | ORAL | Status: AC

## 2023-09-13 NOTE — Patient Instructions (Signed)

## 2023-09-13 NOTE — Progress Notes (Signed)
 09/13/2023, 10:24 AM  Endocrinology follow-up note   Subjective:    Patient ID: Frederick Alexander, male    DOB: 1970-07-02.  Frederick Alexander is being seen in follow-up after he was seen in consultation for management of currently uncontrolled symptomatic diabetes requested by  Skillman, Katherine E, PA-C.   Past Medical History:  Diagnosis Date   Diabetes mellitus (HCC)    type 2   Hyperlipidemia    Hypertension    PONV (postoperative nausea and vomiting)     Past Surgical History:  Procedure Laterality Date   BIOPSY  10/16/2021   Procedure: BIOPSY;  Surgeon: Cindie Carlin POUR, DO;  Location: [facility];  Service: Endoscopy;;   CHOLECYSTECTOMY  10/04/2016   Dr. Ivery   COLONOSCOPY N/A 04/11/2023   Procedure: COLONOSCOPY;  Surgeon: Cindie Carlin POUR, DO;  Location: [facility];  Service: Endoscopy;  Laterality: N/A;  730AM, ASA 3   ESOPHAGOGASTRODUODENOSCOPY (EGD) WITH PROPOFOL  N/A 10/16/2021   Procedure: ESOPHAGOGASTRODUODENOSCOPY (EGD) WITH PROPOFOL ;  Surgeon: Cindie Carlin POUR, DO;  Location: [facility];  Service: Endoscopy;  Laterality: N/A;  9:00 am   POLYPECTOMY  04/11/2023   Procedure: POLYPECTOMY, INTESTINE;  Surgeon: Cindie Carlin POUR, DO;  Location: [facility];  Service: Endoscopy;;   RIGHT/LEFT HEART CATH AND CORONARY ANGIOGRAPHY N/A 03/25/2017   Procedure: RIGHT/LEFT HEART CATH AND CORONARY ANGIOGRAPHY;  Surgeon: Dann Candyce RAMAN, MD;  Location: [facility];  Service: Cardiovascular;  Laterality: N/A;    Social History   Socioeconomic History   Marital status: Married    Spouse name: Not on file   Number of children: Not on file   Years of education: Not on file   Highest education level: Not on file  Occupational History   Not on file  Tobacco Use   Smoking status: Never   Smokeless tobacco: Never  Vaping Use   Vaping status: Never Used  Substance and Sexual  Activity   Alcohol use: Never   Drug use: Never   Sexual activity: Yes  Other Topics Concern   Not on file  Social History Narrative   Not on file   Social Drivers of Health   Financial Resource Strain: Not on file  Food Insecurity: Not on file  Transportation Needs: Not on file  Physical Activity: Not on file  Stress: Not on file  Social Connections: Not on file    Family History  Problem Relation Age of Onset   Diabetes Maternal Grandmother    Colon cancer Neg Hx    Gastric cancer Neg Hx    Esophageal cancer Neg Hx    Inflammatory bowel disease Neg Hx    Liver disease Neg Hx    Autoimmune disease Neg Hx     Outpatient Encounter Medications as of 09/13/2023  Medication Sig   Insulin  Glargine (BASAGLAR  KWIKPEN [place]) Inject 30 Units into the skin daily with breakfast.   amLODipine  (NORVASC ) 10 MG tablet Take 1 tablet (10 mg total) by mouth daily.   ASPIRIN  81 PO Take 1 tablet by mouth daily.   cholestyramine  (QUESTRAN ) 4 GM/DOSE powder Take  1 packet (4 g total) by mouth daily. Mix with 4-6 oz liquid. Take other meds 1 hr before or 4-6 hr after cholestyramine .   Continuous Glucose Sensor (FREESTYLE LIBRE 2 SENSOR) MISC by Does not apply route.   cyanocobalamin (VITAMIN B12) 500 MCG tablet Take 500 mcg by mouth daily.   empagliflozin  (JARDIANCE ) 25 MG TABS tablet Take 1 tablet (25 mg total) by mouth daily before breakfast.   Ferrous Sulfate (IRON PO) Take 1 tablet by mouth daily.   losartan  (COZAAR ) 25 MG tablet Take 50 mg by mouth daily. Take 2 tablets to make 50 mg daily   metFORMIN  (GLUCOPHAGE -XR) 500 MG 24 hr tablet Take 1 tablet (500 mg total) by mouth daily with breakfast.   Misc Natural Products (BEET ROOT PO) Take 1 capsule by mouth daily.   montelukast (SINGULAIR) 10 MG tablet Take 1 tablet by mouth at bedtime. (Patient not taking: No sig reported)   Multiple Vitamins-Minerals (MULTIVITAMIN MEN 50+ PO) Take 1 tablet by mouth daily.   naproxen sodium (ALEVE) 220 MG  tablet Take 220 mg by mouth daily.    pantoprazole  (PROTONIX ) 40 MG tablet TAKE 1 TABLET BY MOUTH TWICE DAILY BEFORE a meal   vitamin C (ASCORBIC ACID) 250 MG tablet Take 250 mg by mouth daily.   [DISCONTINUED] empagliflozin  (JARDIANCE ) 10 MG TABS tablet Take 1 tablet (10 mg total) by mouth daily before breakfast.   [DISCONTINUED] lisinopril (ZESTRIL) 5 MG tablet Take 5 mg by mouth daily. (Patient not taking: Reported on 09/08/2023)   [DISCONTINUED] metFORMIN  (GLUCOPHAGE -XR) 500 MG 24 hr tablet Take 1 tablet (500 mg total) by mouth 2 (two) times daily after a meal. (Patient taking differently: Take 1,000 mg by mouth daily.)   No facility-administered encounter medications on file as of 09/13/2023.    ALLERGIES: Allergies  Allergen Reactions   Prednisone Other (See Comments)    Uratic behavior     VACCINATION STATUS:  There is no immunization history on file for this patient.  Diabetes He presents for his follow-up diabetic visit. He has type 2 diabetes mellitus. Onset time: He was diagnosed approximate age of 30 years, at which time he weighed approximately 300 pounds. His disease course has been improving. There are no hypoglycemic associated symptoms. Pertinent negatives for hypoglycemia include no confusion, headaches, pallor or seizures. Associated symptoms include blurred vision, polydipsia and polyuria. Pertinent negatives for diabetes include no chest pain, no fatigue, no polyphagia and no weakness. Hypoglycemia complications include hospitalization. (He reports at least 1 time hospitalization for hypoglycemia.) Symptoms are improving. Diabetic complications include peripheral neuropathy. Risk factors for coronary artery disease include diabetes mellitus, hypertension, family history and male sex. Current diabetic treatments: He is currently on Basaglar  40 units every 24 hours, metformin  1000 mg p.o. twice daily. His weight is fluctuating minimally. He is following a generally unhealthy  diet. When asked about meal planning, he reported none. He has not had a previous visit with a dietitian. He rarely participates in exercise. His home blood glucose trend is decreasing steadily. His breakfast blood glucose range is generally 180-200 mg/dl. His lunch blood glucose range is generally 180-200 mg/dl. His dinner blood glucose range is generally 180-200 mg/dl. His bedtime blood glucose range is generally 180-200 mg/dl. His overall blood glucose range is 180-200 mg/dl. (He presents with freestyle libre device showing much better 39% in range, 36% level 1 hyperglycemia, 25% level 2 hyperglycemia.  His average blood glucose is 204 improving from 265 to his last visit.  His  GMI is 8.2% compared to his most recent A1c of 10.1%.   ) An ACE inhibitor/angiotensin II receptor blocker is not being taken.  Hypertension This is a chronic problem. The current episode started more than 1 year ago. The problem is uncontrolled. Associated symptoms include blurred vision. Pertinent negatives include no chest pain, headaches, neck pain, palpitations or shortness of breath. Risk factors for coronary artery disease include diabetes mellitus, dyslipidemia and sedentary lifestyle. Past treatments include angiotensin blockers. Identifiable causes of hypertension include chronic renal disease.     Review of Systems  Constitutional:  Negative for chills, fatigue, fever and unexpected weight change.  HENT:  Negative for dental problem, mouth sores and trouble swallowing.   Eyes:  Positive for blurred vision. Negative for visual disturbance.  Respiratory:  Negative for cough, choking, chest tightness, shortness of breath and wheezing.   Cardiovascular:  Negative for chest pain, palpitations and leg swelling.  Gastrointestinal:  Negative for abdominal distention, abdominal pain, constipation, diarrhea, nausea and vomiting.  Endocrine: Positive for polydipsia and polyuria. Negative for polyphagia.  Genitourinary:   Negative for dysuria, flank pain, hematuria and urgency.  Musculoskeletal:  Negative for back pain, gait problem, myalgias and neck pain.  Skin:  Negative for pallor, rash and wound.  Neurological:  Negative for seizures, syncope, weakness, numbness and headaches.  Psychiatric/Behavioral: Negative.  Negative for confusion and dysphoric mood.     Objective:       09/13/2023    9:08 AM 09/13/2023    8:38 AM 09/12/2023   11:18 AM  Vitals with BMI  Height  5' 11 5' 11  Weight  181 lbs 6 oz 182 lbs 13 oz  BMI  25.31 25.51  Systolic 156 172 831  Diastolic 86 90 93  Pulse  96 87    BP (!) 156/86 Comment: R arm with manual cuff. Pt states he has not taken his medication this morning. Dr.Deseree Zemaitis made aware.  Pulse 96   Ht 5' 11 (1.803 m)   Wt 181 lb 6.4 oz (82.3 kg)   BMI 25.30 kg/m   Wt Readings from Last 3 Encounters:  09/13/23 181 lb 6.4 oz (82.3 kg)  09/12/23 182 lb 12.8 oz (82.9 kg)  08/26/23 182 lb 3.2 oz (82.6 kg)       CMP ( most recent) CMP     Component Value Date/Time   NA 138 09/12/2023 1229   K 4.3 09/12/2023 1229   CL 102 09/12/2023 1229   CO2 26 09/12/2023 1229   GLUCOSE 145 (H) 09/12/2023 1229   BUN 29 (H) 09/12/2023 1229   BUN 36 (A) 07/05/2023 0000   CREATININE 1.19 09/12/2023 1229   CALCIUM  9.3 09/12/2023 1229   PROT 7.0 09/12/2023 1229   ALBUMIN 4.0 09/12/2023 1229   AST 46 (H) 09/12/2023 1229   ALT 62 (H) 09/12/2023 1229   ALKPHOS 130 (H) 09/12/2023 1229   BILITOT 0.8 09/12/2023 1229   EGFR 49 07/05/2023 0000   GFRNONAA >60 09/12/2023 1229     Diabetic Labs (most recent): Lab Results  Component Value Date   HGBA1C 10.1 07/05/2023   HGBA1C 10.6 (A) 09/20/2018   HGBA1C 11.4 07/28/2018     Lipid Panel ( most recent) Lipid Panel     Component Value Date/Time   CHOL 95 07/05/2023 0000   TRIG 175 (A) 07/05/2023 0000   HDL 58 07/05/2023 0000   LDLCALC 15 07/05/2023 0000       Assessment & Plan:   1. Poorly  controlled type 2 diabetes  mellitus with neuropathy (HCC) (Primary)  - Frederick Alexander has currently uncontrolled symptomatic type 2 DM since  53 years of age.  He presents with freestyle libre device showing much better 39% in range, 36% level 1 hyperglycemia, 25% level 2 hyperglycemia.  His average blood glucose is 204 improving from 265 to his last visit.  His GMI is 8.2% compared to his most recent A1c of 10.1%.     -Recent labs reviewed. - I had a long discussion with him about the possible risk factors and  the pathology behind diabetes and its complications. -his diabetes is complicated by neuropathy, comorbid hypertension, hypertriglyceridemia and he remains at exceedingly high risk for more acute and chronic complications which include CAD, CVA, CKD, retinopathy, and neuropathy. These are all discussed in detail with him.  - I discussed all available options of managing his diabetes including de-escalation of medications. I have counseled him on Food as Medicine by adopting a Whole Food , Plant Predominant  ( WFPP) nutrition as recommended by [employer] of Lifestyle Medicine. Patient is encouraged to switch to  unprocessed or minimally processed  complex starch, adequate protein intake (mainly plant source), minimal liquid fat, plenty of fruits, and vegetables. -  he is advised to stick to a routine mealtimes to eat 3 complete meals a day and snack only when necessary (to snack only to correct hypoglycemia BG <70 day time or <100 at night).    - he acknowledges that there is a room for improvement in his food and drink choices. - Further Specific Suggestion is made for him to avoid simple carbohydrates  from his diet including Cakes, Sweet Desserts, Ice Cream, Soda (diet and regular), Sweet Tea, Candies, Chips, Cookies, Store Bought Juices, Alcohol ,  Artificial Sweeteners,  Coffee Creamer, and Sugar-free Products. This will help patient to have more stable blood glucose profile and potentially avoid unintended  weight gain.  - he will be scheduled with Penny Crumpton, RDN, CDE for individualized diabetes education.  - I have approached him with the following individualized plan to manage  his diabetes and patient agrees:   - Based on his presentation with significant improvement in his glycemic profile, he can postpone the need for multiple daily injections of insulin  for now.     - He worries about hypoglycemia, approached him to increase Basaglar  to 30 units daily with breakfast, associated with continuous monitoring of blood glucose using his CGM device.   - he is encouraged to call clinic for blood glucose levels less than 70 or above 200 mg per DL weekly average.   - He he is benefiting from the SGLT2 inhibitor prescribed for him during his last visit.  I advised him to increase Jardiance  to 25 mg p.o. daily at breakfast.     Side effects and precautions discussed with him.   -He is advised to lower the dose of his metformin  to 500 mg XR p.o. daily at breakfast due to CKD.     - Specific targets for  A1c;  LDL, HDL,  and Triglycerides were discussed with the patient.  2) Blood Pressure /Hypertension: His blood pressure is not controlled to target.  He is advised to increase losartan  to 50 mg p.o. daily along with his amlodipine  10 mg p.o. daily at breakfast.  Patient reports that he did not take his blood pressure medications this morning, however was found to have blood pressure 156/86 after 15 minutes of resting in the  clinic.  3) Lipids/Hyperlipidemia:   Review of his recent lipid panel showed uncontrolled triglycerides at 175, however low LDL of 15 , total cholesterol 95, HDL 58.  He was never treated with Repatha.  Not clear if he has syndromic dyslipidemia at this time, however he is advised to stay on his cholestyramine .  He is not on statins for now.  Patient has MASLD, status post liver biopsy.  He will have repeat fasting lipid panel on subsequent visits.  He will likely benefit from  whole food plant-based diet which is briefly discussed with him.   4)  Weight/Diet:  Body mass index is 25.3 kg/m.  -    he is not a candidate for major weight loss. I discussed with him the fact that loss of 5 - 10% of his  current body weight will have the most impact on his diabetes management.  The above detailed  ACLM recommendations for nutrition, exercise, sleep, social life, avoidance of risky substances, the need for restorative sleep  information is also detailed on discharge instructions.  5) Chronic Care/Health Maintenance:  -he  is on ARB  medications and  is encouraged to initiate and continue to follow up with Ophthalmology, Dentist,  Podiatrist at least yearly or according to recommendations, and advised to   stay away from smoking. I have recommended yearly flu vaccine and pneumonia vaccine at least every 5 years; moderate intensity exercise for up to 150 minutes weekly; and  sleep for 7- 9 hours a day.  - he is  advised to maintain close follow up with Skillman, Katherine E, PA-C for primary care needs, as well as his other providers for optimal and coordinated care.   I spent  41  minutes in the care of the patient today including review of labs from CMP, Lipids, Thyroid Function, Hematology (current and previous including abstractions from other facilities); face-to-face time discussing  his blood glucose readings/logs, discussing hypoglycemia and hyperglycemia episodes and symptoms, medications doses, his options of short and long term treatment based on the latest standards of care / guidelines;  discussion about incorporating lifestyle medicine;  and documenting the encounter. Risk reduction counseling performed per USPSTF guidelines to reduce cardiovascular risk factors.     Please refer to Patient Instructions for Blood Glucose Monitoring and Insulin /Medications Dosing Guide  in media tab for additional information. Please  also refer to  Patient Self Inventory in the Media   tab for reviewed elements of pertinent patient history.  Frederick Alexander participated in the discussions, expressed understanding, and voiced agreement with the above plans.  All questions were answered to his satisfaction. he is encouraged to contact clinic should he have any questions or concerns prior to his return visit.   Follow up plan: - Return in about 9 weeks (around 11/15/2023) for Bring Meter/CGM Device/Logs- A1c in Office.  Frederick Earl, MD [facility] [address] [place], [place] [address] Phone: [phone]  Fax: [phone]    09/13/2023, 10:24 AM  This note was partially dictated with voice recognition software. Similar sounding words can be transcribed inadequately or may not  be corrected upon review.

## 2023-09-14 ENCOUNTER — Inpatient Hospital Stay (HOSPITAL_COMMUNITY)

## 2023-09-14 ENCOUNTER — Other Ambulatory Visit: Payer: Self-pay

## 2023-09-14 ENCOUNTER — Encounter (HOSPITAL_COMMUNITY): Payer: Self-pay | Admitting: Thoracic Surgery (Cardiothoracic Vascular Surgery)

## 2023-09-14 ENCOUNTER — Encounter (HOSPITAL_COMMUNITY)
Admission: AD | Disposition: A | Discharge status: Home / Self Care | Payer: Self-pay | Source: Home / Self Care | Attending: Thoracic Surgery (Cardiothoracic Vascular Surgery)

## 2023-09-14 ENCOUNTER — Inpatient Hospital Stay (HOSPITAL_COMMUNITY)
Admission: AD | Admit: 2023-09-14 | Discharge: 2023-09-16 | DRG: 271 | Disposition: A | Discharge status: Home / Self Care | Attending: Thoracic Surgery (Cardiothoracic Vascular Surgery) | Admitting: Thoracic Surgery (Cardiothoracic Vascular Surgery)

## 2023-09-14 ENCOUNTER — Ambulatory Visit (HOSPITAL_COMMUNITY): Payer: Self-pay | Admitting: Certified Registered Nurse Anesthetist

## 2023-09-14 DIAGNOSIS — R002 Palpitations: Secondary | ICD-10-CM | POA: Diagnosis present

## 2023-09-14 DIAGNOSIS — E785 Hyperlipidemia, unspecified: Secondary | ICD-10-CM | POA: Diagnosis present

## 2023-09-14 DIAGNOSIS — Z7984 Long term (current) use of oral hypoglycemic drugs: Secondary | ICD-10-CM

## 2023-09-14 DIAGNOSIS — K219 Gastro-esophageal reflux disease without esophagitis: Secondary | ICD-10-CM | POA: Diagnosis present

## 2023-09-14 DIAGNOSIS — Q2548 Anomalous origin of subclavian artery: Secondary | ICD-10-CM | POA: Diagnosis not present

## 2023-09-14 DIAGNOSIS — I708 Atherosclerosis of other arteries: Secondary | ICD-10-CM | POA: Diagnosis present

## 2023-09-14 DIAGNOSIS — Z9049 Acquired absence of other specified parts of digestive tract: Secondary | ICD-10-CM

## 2023-09-14 DIAGNOSIS — Z9889 Other specified postprocedural states: Principal | ICD-10-CM | POA: Diagnosis present

## 2023-09-14 DIAGNOSIS — Q278 Other specified congenital malformations of peripheral vascular system: Secondary | ICD-10-CM | POA: Diagnosis not present

## 2023-09-14 DIAGNOSIS — K222 Esophageal obstruction: Secondary | ICD-10-CM | POA: Diagnosis present

## 2023-09-14 DIAGNOSIS — J9811 Atelectasis: Secondary | ICD-10-CM | POA: Diagnosis not present

## 2023-09-14 DIAGNOSIS — Z01818 Encounter for other preprocedural examination: Secondary | ICD-10-CM

## 2023-09-14 DIAGNOSIS — E119 Type 2 diabetes mellitus without complications: Secondary | ICD-10-CM | POA: Diagnosis not present

## 2023-09-14 DIAGNOSIS — Z79899 Other long term (current) drug therapy: Secondary | ICD-10-CM | POA: Diagnosis not present

## 2023-09-14 DIAGNOSIS — Z7982 Long term (current) use of aspirin: Secondary | ICD-10-CM | POA: Diagnosis not present

## 2023-09-14 DIAGNOSIS — I10 Essential (primary) hypertension: Secondary | ICD-10-CM | POA: Diagnosis present

## 2023-09-14 DIAGNOSIS — Z888 Allergy status to other drugs, medicaments and biological substances status: Secondary | ICD-10-CM

## 2023-09-14 DIAGNOSIS — Z833 Family history of diabetes mellitus: Secondary | ICD-10-CM

## 2023-09-14 DIAGNOSIS — R1319 Other dysphagia: Secondary | ICD-10-CM | POA: Diagnosis present

## 2023-09-14 DIAGNOSIS — R42 Dizziness and giddiness: Secondary | ICD-10-CM | POA: Diagnosis present

## 2023-09-14 DIAGNOSIS — N179 Acute kidney failure, unspecified: Secondary | ICD-10-CM | POA: Diagnosis not present

## 2023-09-14 DIAGNOSIS — E1165 Type 2 diabetes mellitus with hyperglycemia: Secondary | ICD-10-CM | POA: Diagnosis present

## 2023-09-14 DIAGNOSIS — Z794 Long term (current) use of insulin: Secondary | ICD-10-CM

## 2023-09-14 HISTORY — PX: THORACOSCOPY, ROBOT-ASSISTED: SHX7645

## 2023-09-14 HISTORY — PX: CAROTID-SUBCLAVIAN BYPASS GRAFT: SHX910

## 2023-09-14 LAB — POCT I-STAT, CHEM 8
BUN: 26 mg/dL — ABNORMAL HIGH (ref 6–20)
Calcium, Ion: 1.17 mmol/L (ref 1.15–1.40)
Chloride: 107 mmol/L (ref 98–111)
Creatinine, Ser: 1.1 mg/dL (ref 0.61–1.24)
Glucose, Bld: 117 mg/dL — ABNORMAL HIGH (ref 70–99)
HCT: 29 % — ABNORMAL LOW (ref 39.0–52.0)
Hemoglobin: 9.9 g/dL — ABNORMAL LOW (ref 13.0–17.0)
Potassium: 4.1 mmol/L (ref 3.5–5.1)
Sodium: 141 mmol/L (ref 135–145)
TCO2: 25 mmol/L (ref 22–32)

## 2023-09-14 LAB — HEMOGLOBIN A1C
Hgb A1c MFr Bld: 9.1 % — ABNORMAL HIGH (ref 4.8–5.6)
Mean Plasma Glucose: 214.47 mg/dL

## 2023-09-14 LAB — PROTIME-INR
INR: 0.9 (ref 0.8–1.2)
Prothrombin Time: 12.5 s (ref 11.4–15.2)

## 2023-09-14 LAB — GLUCOSE, CAPILLARY
Glucose-Capillary: 105 mg/dL — ABNORMAL HIGH (ref 70–99)
Glucose-Capillary: 125 mg/dL — ABNORMAL HIGH (ref 70–99)
Glucose-Capillary: 127 mg/dL — ABNORMAL HIGH (ref 70–99)
Glucose-Capillary: 301 mg/dL — ABNORMAL HIGH (ref 70–99)

## 2023-09-14 LAB — ABO/RH: ABO/RH(D): O POS

## 2023-09-14 LAB — PREPARE RBC (CROSSMATCH)

## 2023-09-14 LAB — POCT ACTIVATED CLOTTING TIME
Activated Clotting Time: 227 s
Activated Clotting Time: 233 s
Activated Clotting Time: 256 s

## 2023-09-14 SURGERY — THORACOSCOPY, ROBOT-ASSISTED
Anesthesia: General | Site: Chest | Laterality: Right

## 2023-09-14 MED ORDER — HEPARIN 6000 UNIT IRRIGATION SOLUTION
Status: AC
  Filled 2023-09-14: qty 500

## 2023-09-14 MED ORDER — PHENYLEPHRINE HCL-NACL 20-0.9 MG/250ML-% IV SOLN
INTRAVENOUS | Status: DC | PRN
  Administered 2023-09-14: 30 ug/min via INTRAVENOUS

## 2023-09-14 MED ORDER — ROCURONIUM BROMIDE 10 MG/ML (PF) SYRINGE
PREFILLED_SYRINGE | INTRAVENOUS | Status: AC
  Filled 2023-09-14: qty 20

## 2023-09-14 MED ORDER — DEXAMETHASONE SODIUM PHOSPHATE 10 MG/ML IJ SOLN
INTRAMUSCULAR | Status: DC | PRN
  Administered 2023-09-14: 5 mg via INTRAVENOUS

## 2023-09-14 MED ORDER — OXYCODONE HCL 5 MG/5ML PO SOLN: 5.0000 mg | Freq: Once | ORAL | Status: DC | PRN

## 2023-09-14 MED ORDER — CLEVIDIPINE BUTYRATE 0.5 MG/ML IV EMUL
INTRAVENOUS | Status: AC
  Filled 2023-09-14: qty 50

## 2023-09-14 MED ORDER — HYDROMORPHONE HCL 1 MG/ML IJ SOLN: 0.2500 mg | INTRAMUSCULAR | Status: DC | PRN

## 2023-09-14 MED ORDER — ACETAMINOPHEN 10 MG/ML IV SOLN: 1000.0000 mg | Freq: Once | INTRAVENOUS | Status: DC | PRN

## 2023-09-14 MED ORDER — POLYETHYLENE GLYCOL 3350 17 G PO PACK: 17.0000 g | PACK | Freq: Every day | ORAL | Status: DC | PRN

## 2023-09-14 MED ORDER — ORAL CARE MOUTH RINSE: 15.0000 mL | OROMUCOSAL | Status: DC | PRN

## 2023-09-14 MED ORDER — LABETALOL HCL 5 MG/ML IV SOLN
INTRAVENOUS | Status: AC
  Administered 2023-09-14: 10 mg via INTRAVENOUS
  Filled 2023-09-14: qty 4

## 2023-09-14 MED ORDER — CEFAZOLIN SODIUM-DEXTROSE 2-4 GM/100ML-% IV SOLN
2.0000 g | INTRAVENOUS | Status: AC
  Administered 2023-09-14 (×2): 2 g via INTRAVENOUS
  Filled 2023-09-14: qty 100

## 2023-09-14 MED ORDER — SODIUM CHLORIDE FLUSH 0.9 % IV SOLN
INTRAVENOUS | Status: DC | PRN
  Administered 2023-09-14: 95 mL

## 2023-09-14 MED ORDER — ACETAMINOPHEN 325 MG RE SUPP: 325.0000 mg | RECTAL | Status: DC | PRN

## 2023-09-14 MED ORDER — SCOPOLAMINE 1 MG/3DAYS TD PT72
1.0000 | MEDICATED_PATCH | TRANSDERMAL | Status: DC
  Administered 2023-09-14: 1 mg via TRANSDERMAL
  Filled 2023-09-14: qty 1

## 2023-09-14 MED ORDER — CHLORHEXIDINE GLUCONATE CLOTH 2 % EX PADS
6.0000 | MEDICATED_PAD | Freq: Every day | CUTANEOUS | Status: DC
  Administered 2023-09-14 – 2023-09-15 (×2): 6 via TOPICAL

## 2023-09-14 MED ORDER — DROPERIDOL 2.5 MG/ML IJ SOLN
INTRAMUSCULAR | Status: AC
  Filled 2023-09-14: qty 2

## 2023-09-14 MED ORDER — METOPROLOL TARTRATE 5 MG/5ML IV SOLN: 2.5000 mg | INTRAVENOUS | Status: DC | PRN

## 2023-09-14 MED ORDER — CEFAZOLIN SODIUM-DEXTROSE 2-4 GM/100ML-% IV SOLN
2.0000 g | Freq: Three times a day (TID) | INTRAVENOUS | Status: AC
  Administered 2023-09-14 (×2): 2 g via INTRAVENOUS
  Filled 2023-09-14 (×2): qty 100

## 2023-09-14 MED ORDER — ACETAMINOPHEN 325 MG PO TABS: 325.0000 mg | ORAL_TABLET | ORAL | Status: DC | PRN

## 2023-09-14 MED ORDER — DEXAMETHASONE SODIUM PHOSPHATE 10 MG/ML IJ SOLN
INTRAMUSCULAR | Status: AC
  Filled 2023-09-14: qty 1

## 2023-09-14 MED ORDER — CHLORHEXIDINE GLUCONATE CLOTH 2 % EX PADS
6.0000 | MEDICATED_PAD | Freq: Once | CUTANEOUS | Status: DC
Start: 2023-09-14 — End: 2023-09-14

## 2023-09-14 MED ORDER — PROTAMINE SULFATE 10 MG/ML IV SOLN
INTRAVENOUS | Status: DC | PRN
  Administered 2023-09-14: 50 mg via INTRAVENOUS

## 2023-09-14 MED ORDER — DOCUSATE SODIUM 100 MG PO CAPS
100.0000 mg | ORAL_CAPSULE | Freq: Every day | ORAL | Status: DC
  Administered 2023-09-15: 100 mg via ORAL
  Filled 2023-09-14: qty 1

## 2023-09-14 MED ORDER — HEPARIN 6000 UNIT IRRIGATION SOLUTION
Status: DC | PRN
  Administered 2023-09-14: 1

## 2023-09-14 MED ORDER — MEPERIDINE HCL 25 MG/ML IJ SOLN
6.2500 mg | INTRAMUSCULAR | Status: DC | PRN
  Filled 2023-09-14: qty 1

## 2023-09-14 MED ORDER — LIDOCAINE HCL 1 % IJ SOLN
INTRAMUSCULAR | Status: AC
  Filled 2023-09-14: qty 20

## 2023-09-14 MED ORDER — SODIUM CHLORIDE 0.9% FLUSH: 10.0000 mL | INTRAVENOUS | Status: DC | PRN

## 2023-09-14 MED ORDER — 0.9 % SODIUM CHLORIDE (POUR BTL) OPTIME
TOPICAL | Status: DC | PRN
  Administered 2023-09-14: 2000 mL

## 2023-09-14 MED ORDER — PHENYLEPHRINE 80 MCG/ML (10ML) SYRINGE FOR IV PUSH (FOR BLOOD PRESSURE SUPPORT)
PREFILLED_SYRINGE | INTRAVENOUS | Status: DC | PRN
  Administered 2023-09-14: 160 ug via INTRAVENOUS

## 2023-09-14 MED ORDER — SODIUM CHLORIDE 0.9% FLUSH
10.0000 mL | Freq: Two times a day (BID) | INTRAVENOUS | Status: DC
  Administered 2023-09-14 – 2023-09-15 (×4): 10 mL

## 2023-09-14 MED ORDER — SODIUM CHLORIDE (PF) 0.9 % IJ SOLN
INTRAMUSCULAR | Status: AC
  Filled 2023-09-14: qty 50

## 2023-09-14 MED ORDER — CHLORHEXIDINE GLUCONATE 0.12 % MT SOLN
OROMUCOSAL | Status: AC
  Administered 2023-09-14: 15 mL via OROMUCOSAL
  Filled 2023-09-14: qty 15

## 2023-09-14 MED ORDER — FENTANYL CITRATE (PF) 250 MCG/5ML IJ SOLN
INTRAMUSCULAR | Status: AC
  Filled 2023-09-14: qty 5

## 2023-09-14 MED ORDER — CHLORHEXIDINE GLUCONATE 0.12 % MT SOLN
15.0000 mL | Freq: Once | OROMUCOSAL | Status: AC
  Filled 2023-09-14: qty 15

## 2023-09-14 MED ORDER — PROPOFOL 10 MG/ML IV BOLUS
INTRAVENOUS | Status: DC | PRN
  Administered 2023-09-14: 150 mg via INTRAVENOUS

## 2023-09-14 MED ORDER — AMLODIPINE BESYLATE 10 MG PO TABS
10.0000 mg | ORAL_TABLET | Freq: Every day | ORAL | Status: DC
Start: 2023-09-15 — End: 2023-09-16
  Administered 2023-09-15: 10 mg via ORAL
  Filled 2023-09-14: qty 1

## 2023-09-14 MED ORDER — FENTANYL CITRATE (PF) 250 MCG/5ML IJ SOLN
INTRAMUSCULAR | Status: DC | PRN
  Administered 2023-09-14: 100 ug via INTRAVENOUS
  Administered 2023-09-14 (×7): 50 ug via INTRAVENOUS

## 2023-09-14 MED ORDER — MIDAZOLAM HCL 2 MG/2ML IJ SOLN
INTRAMUSCULAR | Status: AC
  Filled 2023-09-14: qty 2

## 2023-09-14 MED ORDER — SODIUM CHLORIDE 0.9 % IR SOLN
Status: DC | PRN
  Administered 2023-09-14: 1000 mL

## 2023-09-14 MED ORDER — OXYCODONE-ACETAMINOPHEN 5-325 MG PO TABS
1.0000 | ORAL_TABLET | ORAL | Status: DC | PRN
  Administered 2023-09-15 – 2023-09-16 (×4): 2 via ORAL
  Filled 2023-09-14 (×4): qty 2

## 2023-09-14 MED ORDER — ACETAMINOPHEN 10 MG/ML IV SOLN
INTRAVENOUS | Status: DC | PRN
Start: 2023-09-14 — End: 2023-09-14
  Administered 2023-09-14: 1000 mg via INTRAVENOUS

## 2023-09-14 MED ORDER — SODIUM CHLORIDE 0.9 % IV SOLN: 500.0000 mL | Freq: Once | INTRAVENOUS | Status: DC | PRN

## 2023-09-14 MED ORDER — LABETALOL HCL 5 MG/ML IV SOLN
10.0000 mg | INTRAVENOUS | Status: DC | PRN
  Administered 2023-09-14: 10 mg via INTRAVENOUS
  Filled 2023-09-14: qty 4

## 2023-09-14 MED ORDER — PHENYLEPHRINE 80 MCG/ML (10ML) SYRINGE FOR IV PUSH (FOR BLOOD PRESSURE SUPPORT)
PREFILLED_SYRINGE | INTRAVENOUS | Status: AC
  Filled 2023-09-14: qty 10

## 2023-09-14 MED ORDER — INSULIN ASPART 100 UNIT/ML IJ SOLN
0.0000 [IU] | Freq: Three times a day (TID) | INTRAMUSCULAR | Status: DC
  Administered 2023-09-14: 2 [IU] via SUBCUTANEOUS
  Administered 2023-09-15 (×3): 5 [IU] via SUBCUTANEOUS
  Administered 2023-09-16: 3 [IU] via SUBCUTANEOUS

## 2023-09-14 MED ORDER — LACTATED RINGERS IV SOLN: INTRAVENOUS | Status: DC

## 2023-09-14 MED ORDER — SUGAMMADEX SODIUM 200 MG/2ML IV SOLN
INTRAVENOUS | Status: DC | PRN
  Administered 2023-09-14: 200 mg via INTRAVENOUS

## 2023-09-14 MED ORDER — ROCURONIUM BROMIDE 10 MG/ML (PF) SYRINGE
PREFILLED_SYRINGE | INTRAVENOUS | Status: DC | PRN
  Administered 2023-09-14 (×2): 30 mg via INTRAVENOUS
  Administered 2023-09-14: 20 mg via INTRAVENOUS
  Administered 2023-09-14: 30 mg via INTRAVENOUS
  Administered 2023-09-14: 70 mg via INTRAVENOUS

## 2023-09-14 MED ORDER — MIDAZOLAM HCL 2 MG/2ML IJ SOLN
INTRAMUSCULAR | Status: DC | PRN
  Administered 2023-09-14: 2 mg via INTRAVENOUS

## 2023-09-14 MED ORDER — ONDANSETRON HCL 4 MG/2ML IJ SOLN
INTRAMUSCULAR | Status: DC | PRN
  Administered 2023-09-14: 4 mg via INTRAVENOUS

## 2023-09-14 MED ORDER — OXYCODONE HCL 5 MG PO TABS: 5.0000 mg | ORAL_TABLET | Freq: Once | ORAL | Status: DC | PRN

## 2023-09-14 MED ORDER — LACTATED RINGERS IV SOLN: INTRAVENOUS | Status: DC | PRN

## 2023-09-14 MED ORDER — LIDOCAINE 2% (20 MG/ML) 5 ML SYRINGE
INTRAMUSCULAR | Status: DC | PRN
  Administered 2023-09-14: 5 mg via INTRAVENOUS

## 2023-09-14 MED ORDER — HYDRALAZINE HCL 20 MG/ML IJ SOLN: 5.0000 mg | INTRAMUSCULAR | Status: DC | PRN

## 2023-09-14 MED ORDER — SODIUM CHLORIDE 0.9 % IV SOLN
INTRAVENOUS | Status: DC
Start: 2023-09-14 — End: 2023-09-14

## 2023-09-14 MED ORDER — ONDANSETRON HCL 4 MG/2ML IJ SOLN
INTRAMUSCULAR | Status: AC
  Filled 2023-09-14: qty 2

## 2023-09-14 MED ORDER — MORPHINE SULFATE (PF) 2 MG/ML IV SOLN
2.0000 mg | INTRAVENOUS | Status: DC | PRN
  Administered 2023-09-14 – 2023-09-15 (×2): 2 mg via INTRAVENOUS
  Filled 2023-09-14 (×2): qty 1

## 2023-09-14 MED ORDER — HEPARIN SODIUM (PORCINE) 1000 UNIT/ML IJ SOLN
INTRAMUSCULAR | Status: DC | PRN
  Administered 2023-09-14: 2000 [IU] via INTRAVENOUS
  Administered 2023-09-14: 8000 [IU] via INTRAVENOUS
  Administered 2023-09-14: 2000 [IU] via INTRAVENOUS

## 2023-09-14 MED ORDER — PROPOFOL 10 MG/ML IV BOLUS
INTRAVENOUS | Status: AC
  Filled 2023-09-14: qty 20

## 2023-09-14 MED ORDER — BUPIVACAINE HCL (PF) 0.5 % IJ SOLN
INTRAMUSCULAR | Status: AC
  Filled 2023-09-14: qty 30

## 2023-09-14 MED ORDER — BUPIVACAINE LIPOSOME 1.3 % IJ SUSP
INTRAMUSCULAR | Status: AC
  Filled 2023-09-14: qty 20

## 2023-09-14 MED ORDER — ONDANSETRON HCL 4 MG/2ML IJ SOLN: 4.0000 mg | Freq: Four times a day (QID) | INTRAMUSCULAR | Status: DC | PRN

## 2023-09-14 MED ORDER — DROPERIDOL 2.5 MG/ML IJ SOLN
0.6250 mg | Freq: Once | INTRAMUSCULAR | Status: AC | PRN
  Administered 2023-09-14: 0.625 mg via INTRAVENOUS

## 2023-09-14 MED ORDER — ASPIRIN 81 MG PO CHEW
81.0000 mg | CHEWABLE_TABLET | Freq: Every day | ORAL | Status: DC
  Administered 2023-09-15: 81 mg via ORAL
  Filled 2023-09-14: qty 1

## 2023-09-14 MED ORDER — LIDOCAINE 2% (20 MG/ML) 5 ML SYRINGE
INTRAMUSCULAR | Status: AC
  Filled 2023-09-14: qty 5

## 2023-09-14 SURGICAL SUPPLY — 81 items
BAG COUNTER SPONGE SURGICOUNT (BAG) ×2 IMPLANT
BENZOIN TINCTURE PRP APPL 2/3 (GAUZE/BANDAGES/DRESSINGS) ×2 IMPLANT
CANISTER SUCTION 3000ML PPV (SUCTIONS) ×4 IMPLANT
CANNULA REDUCER 12-8 DVNC XI (CANNULA) ×4 IMPLANT
CANNULA VESSEL 3MM 2 BLNT TIP (CANNULA) ×2 IMPLANT
CATH ROBINSON RED A/P 18FR (CATHETERS) ×2 IMPLANT
CATH THORACIC 28FR (CATHETERS) IMPLANT
CHLORAPREP W/TINT 26 (MISCELLANEOUS) ×2 IMPLANT
CLIP APPLIE ROT 10 11.4 M/L (STAPLE) IMPLANT
CLIP LIGATING EXTRA MED SLVR (CLIP) IMPLANT
CLIP LIGATING EXTRA SM BLUE (MISCELLANEOUS) IMPLANT
CLIP TI WIDE RED SMALL 6 (CLIP) IMPLANT
CNTNR URN SCR LID CUP LEK RST (MISCELLANEOUS) ×10 IMPLANT
CONN ST 1/4X3/8 BEN (MISCELLANEOUS) IMPLANT
COVER SURGICAL LIGHT HANDLE (MISCELLANEOUS) IMPLANT
DEFOGGER SCOPE WARM SEASHARP (MISCELLANEOUS) ×2 IMPLANT
DERMABOND ADVANCED .7 DNX12 (GAUZE/BANDAGES/DRESSINGS) IMPLANT
DRAIN CHANNEL 28F RND 3/8 FF (WOUND CARE) IMPLANT
DRAPE ARM DVNC X/XI (DISPOSABLE) ×8 IMPLANT
DRAPE COLUMN DVNC XI (DISPOSABLE) ×2 IMPLANT
DRAPE CV SPLIT W-CLR ANES SCRN (DRAPES) ×2 IMPLANT
DRAPE HALF SHEET 40X57 (DRAPES) IMPLANT
DRAPE INCISE IOBAN 66X45 STRL (DRAPES) IMPLANT
DRAPE SURG ORHT 6 SPLT 77X108 (DRAPES) ×2 IMPLANT
ELECT BLADE 6.5 EXT (BLADE) ×2 IMPLANT
ELECTRODE REM PT RTRN 9FT ADLT (ELECTROSURGICAL) ×4 IMPLANT
FORCEPS BPLR FENES DVNC XI (FORCEP) IMPLANT
FORCEPS BPLR LNG DVNC XI (INSTRUMENTS) IMPLANT
GAUZE KITTNER 4X5 RF (MISCELLANEOUS) IMPLANT
GAUZE SPONGE 4X4 12PLY STRL (GAUZE/BANDAGES/DRESSINGS) ×2 IMPLANT
GLOVE BIO SURGEON STRL SZ 6.5 (GLOVE) IMPLANT
GLOVE BIO SURGEON STRL SZ8 (GLOVE) ×2 IMPLANT
GLOVE SS BIOGEL STRL SZ 7.5 (GLOVE) ×4 IMPLANT
GOWN STRL REUS W/ TWL LRG LVL3 (GOWN DISPOSABLE) ×8 IMPLANT
GOWN STRL REUS W/ TWL XL LVL3 (GOWN DISPOSABLE) ×6 IMPLANT
GOWN STRL REUS W/TWL 2XL LVL3 (GOWN DISPOSABLE) ×2 IMPLANT
GRASPER TIP-UP FEN DVNC XI (INSTRUMENTS) IMPLANT
HEMOSTAT SURGICEL 2X14 (HEMOSTASIS) ×4 IMPLANT
INSERT FOGARTY SM (MISCELLANEOUS) ×2 IMPLANT
IRRIGATION STRYKERFLOW (MISCELLANEOUS) ×2 IMPLANT
IV NS 1000ML BAXH (IV SOLUTION) ×2 IMPLANT
KIT BASIN OR (CUSTOM PROCEDURE TRAY) ×2 IMPLANT
KIT TURNOVER KIT B (KITS) ×4 IMPLANT
NDL HYPO 25GX1X1/2 BEV (NEEDLE) IMPLANT
NDL SPNL 22GX3.5 QUINCKE BK (NEEDLE) ×2 IMPLANT
NEEDLE HYPO 25GX1X1/2 BEV (NEEDLE) ×2 IMPLANT
NEEDLE SPNL 22GX3.5 QUINCKE BK (NEEDLE) ×2 IMPLANT
NS IRRIG 1000ML POUR BTL (IV SOLUTION) ×6 IMPLANT
PACK CAROTID (CUSTOM PROCEDURE TRAY) ×2 IMPLANT
PAD ARMBOARD POSITIONER FOAM (MISCELLANEOUS) ×8 IMPLANT
PATCH GORETEX 2X9 (Vascular Products) IMPLANT
PENCIL SMOKE EVACUATOR (MISCELLANEOUS) IMPLANT
POSITIONER HEAD DONUT 9IN (MISCELLANEOUS) ×2 IMPLANT
PUNCH AORTIC ROTATE 4.0MM (MISCELLANEOUS) IMPLANT
RELOAD STAPLE 45 2.5 WHT DVNC (STAPLE) IMPLANT
SEAL UNIV 5-12 XI (MISCELLANEOUS) ×8 IMPLANT
SET TRI-LUMEN FLTR TB AIRSEAL (TUBING) ×2 IMPLANT
SHEATH PROBE COVER 6X72 (BAG) IMPLANT
STAPLER 45 SUREFORM CVD DVNC (STAPLE) IMPLANT
STRIP CLOSURE SKIN 1/2X4 (GAUZE/BANDAGES/DRESSINGS) ×2 IMPLANT
SUT MNCRL AB 4-0 PS2 18 (SUTURE) IMPLANT
SUT PDS AB 3-0 SH 27 (SUTURE) IMPLANT
SUT PROLENE 5 0 C 1 24 (SUTURE) IMPLANT
SUT PROLENE 5 0 C 1 36 (SUTURE) IMPLANT
SUT PROLENE 6 0 BV (SUTURE) ×2 IMPLANT
SUT SILK 1 MH (SUTURE) ×2 IMPLANT
SUT VIC AB 3-0 SH 27X BRD (SUTURE) ×4 IMPLANT
SUT VIC AB 3-0 X1 27 (SUTURE) ×4 IMPLANT
SUT VICRYL 0 TIES 12 18 (SUTURE) ×2 IMPLANT
SUT VICRYL 0 UR6 27IN ABS (SUTURE) ×4 IMPLANT
SYR 20CC LL (SYRINGE) ×4 IMPLANT
SYR CONTROL 10ML LL (SYRINGE) IMPLANT
SYSTEM SAHARA CHEST DRAIN ATS (WOUND CARE) ×2 IMPLANT
TAPE CLOTH 4X10 WHT NS (GAUZE/BANDAGES/DRESSINGS) ×2 IMPLANT
TAPE CLOTH SURG 4X10 WHT LF (GAUZE/BANDAGES/DRESSINGS) IMPLANT
TAPE UMBILICAL 1/8X30 (MISCELLANEOUS) IMPLANT
TOWEL GREEN STERILE (TOWEL DISPOSABLE) ×4 IMPLANT
TRAY FOLEY MTR SLVR 16FR STAT (SET/KITS/TRAYS/PACK) ×2 IMPLANT
TROCAR PORT AIRSEAL 12X150 (TUBING) ×2 IMPLANT
TUBE CONNECTING 12X1/4 (SUCTIONS) IMPLANT
WATER STERILE IRR 1000ML POUR (IV SOLUTION) ×2 IMPLANT

## 2023-09-14 NOTE — Anesthesia Postprocedure Evaluation (Signed)
 Anesthesia Post Note  Patient: Frederick Alexander  Procedure(s) Performed: ROBOT-ASSISTED THORACOSCOPY, TO LIGATE THE ABBERRANT RIGHT SUBCLAVIAN ARTERY (Right: Chest) RIGHT CAROTID SUBCLAVIAN TRANSPOSITION (Right)     Patient location during evaluation: PACU Anesthesia Type: General Level of consciousness: awake and alert Pain management: pain level controlled Vital Signs Assessment: post-procedure vital signs reviewed and stable Respiratory status: spontaneous breathing, nonlabored ventilation, respiratory function stable and patient connected to nasal cannula oxygen Cardiovascular status: blood pressure returned to baseline and stable Postop Assessment: no apparent nausea or vomiting Anesthetic complications: no   No notable events documented.  Last Vitals:  Vitals:   09/14/23 1515 09/14/23 1530  BP: 107/66 112/71  Pulse: 86 90  Resp: (!) 8 11  Temp:    SpO2: 96% 94%    Last Pain:  Vitals:   09/14/23 1530  TempSrc:   PainSc: Asleep    LLE Motor Response: Purposeful movement (09/14/23 1530) LLE Sensation: Full sensation (09/14/23 1530) RLE Motor Response: Purposeful movement (09/14/23 1530) RLE Sensation: Full sensation (09/14/23 1530)      Frederick Alexander

## 2023-09-14 NOTE — Interval H&P Note (Signed)
 History and Physical Interval Note:  09/14/2023 7:40 AM  Frederick Alexander  has presented today for surgery, with the diagnosis of Aberrant right subclavian artery.  The various methods of treatment have been discussed with the patient and family. After consideration of risks, benefits and other options for treatment, the patient has consented to  Procedure(s) with comments: THORACOSCOPY, ROBOT-ASSISTED (Right) - Right carotid subclavian bypass and a 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 CREATION, BYPASS, ARTERIAL, SUBCLAVIAN TO CAROTID, USING GRAFT (Right) as a surgical intervention.  The patient's history has been reviewed, patient examined, no change in status, stable for surgery.  I have reviewed the patient's chart and labs.  Questions were answered to the patient's satisfaction.     [name]

## 2023-09-14 NOTE — Anesthesia Preprocedure Evaluation (Addendum)
 Anesthesia Evaluation  Patient identified by MRN, date of birth, ID band Patient awake    Reviewed: Allergy & Precautions, NPO status , Patient's Chart, lab work & pertinent test results  History of Anesthesia Complications (+) PONV and history of anesthetic complications  Airway Mallampati: I  TM Distance: <3 FB Neck ROM: Full    Dental  (+) Teeth Intact, Dental Advisory Given   Pulmonary neg pulmonary ROS   breath sounds clear to auscultation       Cardiovascular hypertension, Pt. on medications  Rhythm:Regular Rate:Normal     Neuro/Psych negative neurological ROS  negative psych ROS   GI/Hepatic Neg liver ROS,GERD  Medicated,,  Endo/Other  diabetes, Type 2, Insulin  Dependent, Oral Hypoglycemic Agents    Renal/GU      Musculoskeletal   Abdominal   Peds  Hematology negative hematology ROS (+)   Anesthesia Other Findings   Reproductive/Obstetrics                              Anesthesia Physical Anesthesia Plan  ASA: 3  Anesthesia Plan: General   Post-op Pain Management: Ofirmev  IV (intra-op)*   Induction: Intravenous  PONV Risk Score and Plan: 4 or greater and Ondansetron , Dexamethasone , Midazolam , Treatment may vary due to age or medical condition and Scopolamine  patch - Pre-op  Airway Management Planned: Double Lumen EBT  Additional Equipment: Arterial line, Ultrasound Guidance Line Placement and CVP  Intra-op Plan:   Post-operative Plan: Extubation in OR  Informed Consent: I have reviewed the patients History and Physical, chart, labs and discussed the procedure including the risks, benefits and alternatives for the proposed anesthesia with the patient or authorized representative who has indicated his/her understanding and acceptance.     Dental advisory given  Plan Discussed with: CRNA  Anesthesia Plan Comments:          Anesthesia Quick Evaluation

## 2023-09-14 NOTE — Op Note (Addendum)
 DATE OF SERVICE: 09/14/2023  PATIENT:  Frederick Alexander  53 y.o. 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 N. Magda, MD   ASSISTANT:  Adina Sender, PA-C  An experienced assistant was required given the complexity of this procedure and the standard of surgical care. My assistant helped with exposure through counter tension, suctioning, ligation and retraction to better visualize the surgical field.  My assistant expedited sewing during the case by following my sutures. Wherever I use the term we in the report, my assistant actively helped me with that portion of the procedure.  ANESTHESIA:   general  EBL: 50mL  BLOOD ADMINISTERED:none  DRAINS: none   LOCAL MEDICATIONS USED:  NONE  SPECIMEN:  none  COUNTS: confirmed correct.  TOURNIQUET:  none  PATIENT DISPOSITION:  PACU - hemodynamically stable.   Delay start of Pharmacological VTE agent (>24hrs) due to surgical blood loss or risk of bleeding: no  INDICATION FOR PROCEDURE: Frederick Alexander is a 53 y.o. male with aberrant right subclavian artery and diagnosis of dysphagia lusoria.  Extensive workup has been performed by Dr. Cindie, the patient's gastroenterologist.  Barium esophagram, upper endoscopy, CT angiogram are all suggestive of compression of the right subclavian artery across the esophagus.  Combined with the patient's constellation of symptoms, both I and Dr. Kerrin felt this artery was likely behind his symptoms and we recommended intervention. After careful discussion of risks, benefits, and alternatives the patient was offered a combined approach with me and Dr. Kerrin, I offer the patient right carotid to subclavian artery transposition. The patient understood and wished to proceed.  OPERATIVE FINDINGS: Healthy common  carotid and subclavian arteries.  Identified the vertebral artery and skeletonized subclavian artery fairly distally into the mediastinum.  Good technical result achieved from transposition with brisk Doppler flow in the proximal common carotid artery, distal common carotid artery, and subclavian artery.  DESCRIPTION OF PROCEDURE: After identification of the patient in the pre-operative holding area, the patient was transferred to the operating room. The patient was positioned supine on the operating room table. Anesthesia was induced. The right neck was prepped and draped in standard fashion. A surgical pause was performed confirming correct patient, procedure, and operative location.  Transverse incision was made over the 2 heads of the sternocleidomastoid and the neck.  The incision was carried down through the subcutaneous tissue and platysma using Bovie electrocautery.  Generous subplatysmal flaps were generated.  We entered the neck through the 2 heads of the sternocleidomastoid and identified the jugular vein.  This was skeletonized for the entire length of our exposure.  We identified the common carotid artery and skeletonized several centimeters of this.  This was encircled with Silastic vessel loop.  The subclavian artery was identified and exposed.  Careful attention was paid to the large vagus nerve running [place] and south of the neck.  There is a smaller nerve branch running over the subclavian artery that was preserved.  We identified the thyrocervical trunk, the internal mammary artery, and the vertebral artery.  Exposure was carried several centimeters into the mediastinum to ensure that I had not misidentified the vertebral artery.  A Cooley clamp was slotted into position in the mediastinum and the patient systemically heparinized.  Activated clotting time measurements were used at the case to confirm adequate anticoagulation.  Once a therapeutic  ACT was achieved, the proximal subclavian artery  was clamped with a Cooley clamp.  The branches of the subclavian artery were secured with Silastic Vesseloops.  A profunda clamp was applied to the distal subclavian artery.  The artery was transected.  The artery was found to mobilize very nicely to the common carotid artery and so I felt a supple transposition would be sufficient.  The proximal stump of the subclavian artery was oversewn using a 2 layer closure.  Hemostasis was achieved after this.  We then made a lateral arteriotomy in the common carotid artery with 11 blade which was extended with Potts scissors.  The arteriotomy was widened with Potts scissors and a 4 mm aortic punch.  The cut end of the Suplasyn artery was then anastomosed end-to-side to the common carotid artery using continuous running suture of 5-0 Prolene.  Prior to completion the anastomosis was flushed and de-aired.  The repair was completed.  Clamp was released on the peripheral subclavian artery and vertebral artery.  Hemostasis was confirmed in the anastomosis.  We then released the clamp on the common carotid artery proximally and then distally.  The repair was evaluated with Doppler machine.  Good Doppler signal was heard in all arteries above and below the transposition.  Satisfied read of the case here.  Heparin  was reversed with protamine .  The platysma was reapproximated with 3-0 Vicryl suture.  The skin was closed with a subcuticular stitch of 4-0 Monocryl.  The case was transferred over to Dr. Kerrin for the intrathoracic portion of the procedure. He will dictate his portion of the case separately.   FOLLOW UP PLAN: Assuming a normal postoperative course, I will see the patient in 4 weeks with carotid duplex.   Debby SAILOR. Magda, MD [facility] Phone Number: [phone] 09/14/2023 2:22 PM

## 2023-09-14 NOTE — H&P (Signed)
 See below for H&P details. Plan R subclavian to common carotid artery transposition followed by VATS by Dr. 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  Debby SAILOR. Magda, MD FACS [facility] Phone Number: [phone] 09/14/2023 7:54 AM   [facility] OF [place]  ASSESSMENT / PLAN: 53 y.o. male with dysphagia lusoria. His symptoms are typical of other cases described in the literature. He has esophageal compression seen both on barium esophagram and CT angiogram. I have reviewed the images with Dr. Kerrin of cardiothoracic surgery. A combined approach would be best to revascularize the right arm and treat the compressive artery from behind the esophagus.  I will refer him to an endocrinologist to help him with difficult to control diabetes.  After he has seen Dr. Kerrin, we can plan for combined case in the near future.  CHIEF COMPLAINT: Dysphagia  HISTORY OF PRESENT ILLNESS: Frederick Alexander is a 53 y.o. male referred to clinic for evaluation of possible dysphagia disorder.  He has undergone extensive workup today.  The patient noticed occasional globus sensation in his throat.  He has frequent coughing which can be relieved by inducing vomiting.  He also has some symptoms of reflux.  He has undergone extensive workup to date.  Dr. Cindie is his gastroenterologist and identified likely dysphagia Lusardi had during his workup.  The bulk of our visit was spent reviewing his workup and explaining the surgical approach to correcting the aberrant right subclavian artery  Past Medical History:  Diagnosis Date   Diabetes mellitus (HCC)    type 2   Hyperlipidemia    Hypertension    PONV (postoperative nausea and vomiting)     Past Surgical History:  Procedure Laterality Date   BIOPSY  10/16/2021   Procedure: BIOPSY;  Surgeon: Cindie Carlin POUR, DO;  [facility];   Service: Endoscopy;;   CHOLECYSTECTOMY  10/04/2016   Dr. Ivery   COLONOSCOPY N/A 04/11/2023   Procedure: COLONOSCOPY;  Surgeon: Cindie Carlin POUR, DO;  [facility];  Service: Endoscopy;  Laterality: N/A;  730AM, ASA 3   ESOPHAGOGASTRODUODENOSCOPY (EGD) WITH PROPOFOL  N/A 10/16/2021   Procedure: ESOPHAGOGASTRODUODENOSCOPY (EGD) WITH PROPOFOL ;  Surgeon: Cindie Carlin POUR, DO;  [facility];  Service: Endoscopy;  Laterality: N/A;  9:00 am   POLYPECTOMY  04/11/2023   Procedure: POLYPECTOMY, INTESTINE;  Surgeon: Cindie Carlin POUR, DO;  [facility]: [facility];  Service: Endoscopy;;   RIGHT/LEFT HEART CATH AND CORONARY ANGIOGRAPHY N/A 03/25/2017   Procedure: RIGHT/LEFT HEART CATH AND CORONARY ANGIOGRAPHY;  Surgeon: Dann Candyce RAMAN, MD;  Location: [facility] INVASIVE CV LAB;  Service: Cardiovascular;  Laterality: N/A;    Family History  Problem Relation Age of Onset   Diabetes Maternal Grandmother    Colon cancer Neg Hx    Gastric cancer Neg Hx    Esophageal cancer Neg Hx    Inflammatory bowel disease Neg Hx    Liver disease Neg Hx    Autoimmune disease Neg Hx     Social History   Socioeconomic History   Marital status: Married    Spouse name: Not on file   Number of children: Not on file   Years of education: Not on file   Highest education level: Not on file  Occupational History   Not on file  Tobacco Use   Smoking status: Never   Smokeless tobacco: Never  Vaping Use   Vaping status: Never  Used  Substance and Sexual Activity   Alcohol use: Never   Drug use: Never   Sexual activity: Yes  Other Topics Concern   Not on file  Social History Narrative   Not on file   Social Drivers of Health   Financial Resource Strain: Not on file  Food Insecurity: Not on file  Transportation Needs: Not on file  Physical Activity: Not on file  Stress: Not on file  Social Connections: Not on file  Intimate Partner Violence: Not on file    Allergies  Allergen  Reactions   Prednisone Other (See Comments)    Uratic behavior     Current Facility-Administered Medications  Medication Dose Route Frequency Provider Last Rate Last Admin   0.9 %  sodium chloride  infusion   Intravenous Continuous Magda Debby SAILOR, MD       ceFAZolin  (ANCEF ) IVPB 2g/100 mL premix  2 g Intravenous 30 min Pre-Op Kerrin Elspeth BROCKS, MD       Chlorhexidine  Gluconate Cloth 2 % PADS 6 each  6 each Topical Once Magda Debby SAILOR, MD       And   Chlorhexidine  Gluconate Cloth 2 % PADS 6 each  6 each Topical Once Sundae Maners N, MD       scopolamine  (TRANSDERM-SCOP) 1 MG/3DAYS 1 mg  1 patch Transdermal Q72H Hollis, Kevin D, MD   1 mg at 09/14/23 0720   Facility-Administered Medications Ordered in Other Encounters  Medication Dose Route Frequency Provider Last Rate Last Admin   fentaNYL  citrate (PF) (SUBLIMAZE ) injection   Intravenous Anesthesia Intra-op Muqtasid, Samantha T, CRNA   50 mcg at 09/14/23 0751   lactated ringers  infusion   Intravenous Continuous PRN Muqtasid, Lucie DASEN, CRNA   New Bag at 09/14/23 0745   midazolam  (VERSED ) injection   Intravenous Anesthesia Intra-op Muqtasid, Lucie T, CRNA   2 mg at 09/14/23 0748    PHYSICAL EXAM Vitals:   09/14/23 0644  BP: (!) 183/99  Pulse: 90  Resp: 20  Temp: 97.8 F (36.6 C)  TempSrc: Oral  SpO2: 98%  Weight: 78 kg  Height: 5' 11 (1.803 m)    Well-appearing middle-age man in no distress Regular rate and rhythm Unlabored breathing Palpable radial pulses bilaterally   PERTINENT LABORATORY AND RADIOLOGIC DATA  Most recent CBC    Latest Ref Rng & Units 09/12/2023   12:29 PM 04/05/2023   11:58 AM  CBC  WBC 4.0 - 10.5 K/uL 7.6  6.7   Hemoglobin 13.0 - 17.0 g/dL 87.3  88.1   Hematocrit 39.0 - 52.0 % 36.0  33.7   Platelets 150 - 400 K/uL 213  198      Most recent CMP    Latest Ref Rng & Units 09/12/2023   12:29 PM 07/05/2023   12:00 AM 04/05/2023   11:58 AM  CMP  Glucose 70 - 99 mg/dL 854   596   BUN 6 - 20  mg/dL 29  36     32   Creatinine 0.61 - 1.24 mg/dL 8.80  1.7     8.94   Sodium 135 - 145 mmol/L 138   134   Potassium 3.5 - 5.1 mmol/L 4.3   4.3   Chloride 98 - 111 mmol/L 102   101   CO2 22 - 32 mmol/L 26   26   Calcium  8.9 - 10.3 mg/dL 9.3   8.7   Total Protein 6.5 - 8.1 g/dL 7.0   6.3   Total Bilirubin  0.0 - 1.2 mg/dL 0.8   0.3   Alkaline Phos 38 - 126 U/L 130  142     178   AST 15 - 41 U/L 46   53   ALT 0 - 44 U/L 62   73      This result is from an external source.    Renal function Estimated Creatinine Clearance: 76.5 mL/min (by C-G formula based on SCr of 1.19 mg/dL).  Hemoglobin A1C (no units)  Date Value  07/05/2023 10.1   Hgb A1c MFr Bld (%)  Date Value  09/14/2023 9.1 (H)    LDL Cholesterol  Date Value Ref Range Status  07/05/2023 15  Final     Barium Swallow 12/08/22: area of compression seen in thoracic esophagus from right subclavian artery.  CT angiogram of chest / abdomen / pelvis 12/23/22: aberrant right subclavian artery compressing the esophagus.  Debby SAILOR. Magda, MD FACS [facility] Phone Number: [phone] 09/14/2023 7:53 AM   Total time spent on preparing this encounter including chart review, data review, collecting history, examining the patient, and coordinating care: 60 minutes  Portions of this report may have been transcribed using voice recognition software.  Every effort has been made to ensure accuracy; however, inadvertent computerized transcription errors may still be present.

## 2023-09-14 NOTE — Progress Notes (Signed)
 Patient ID: Frederick Alexander, male   DOB: [birth year], 53 y.o.   MRN: [id number]   TCTS Evening Rounds:   Hemodynamically stable  Sats 94% RA  Urine output good  CT output low  CBC    Component Value Date/Time   WBC 7.6 09/12/2023 1229   RBC 4.08 (L) 09/12/2023 1229   HGB 9.9 (L) 09/14/2023 1203   HCT 29.0 (L) 09/14/2023 1203   PLT 213 09/12/2023 1229   MCV 88.2 09/12/2023 1229   MCH 30.9 09/12/2023 1229   MCHC 35.0 09/12/2023 1229   RDW 13.5 09/12/2023 1229     BMET    Component Value Date/Time   NA 141 09/14/2023 1203   K 4.1 09/14/2023 1203   CL 107 09/14/2023 1203   CO2 26 09/12/2023 1229   GLUCOSE 117 (H) 09/14/2023 1203   BUN 26 (H) 09/14/2023 1203   BUN 36 (A) 07/05/2023 0000   CREATININE 1.10 09/14/2023 1203   CALCIUM  9.3 09/12/2023 1229   EGFR 49 07/05/2023 0000   GFRNONAA >60 09/12/2023 1229     A/P:  Stable postop course. Continue current plans

## 2023-09-14 NOTE — Brief Op Note (Addendum)
 09/14/2023  12:57 PM  PATIENT:  Frederick Alexander  53 y.o. male  PRE-OPERATIVE DIAGNOSIS:  Aberrant right subclavian artery  POST-OPERATIVE DIAGNOSIS:  Aberrant right subclavian artery  PROCEDURE:  Procedure(s) with comments: ROBOT-ASSISTED THORACOSCOPY, TO LIGATE THE ABBERRANT RIGHT SUBCLAVIAN ARTERY (Right) - Right carotid subclavian bypass and a 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 RIGHT CAROTID SUBCLAVIAN TRANSPOSITION (Right)  SURGEON:  Surgeons and Role: Panel 1:    * Kerrin Elspeth BROCKS, MD - Primary    * Su, Con RAMAN, MD Panel 2:    * Magda Debby SAILOR, MD - Primary  PHYSICIAN ASSISTANT: Bethanie Cough, PA  ASSISTANTS: none   ANESTHESIA:   general  EBL:  130 mL   BLOOD ADMINISTERED:none  DRAINS: 27F Chest Tube(s) in the right   LOCAL MEDICATIONS USED:  OTHER Bupivacaine  + liposomal bupivacaine   SPECIMEN:  Source of Specimen:  right subclavian artery  DISPOSITION OF SPECIMEN:  PATHOLOGY  COUNTS:  YES  TOURNIQUET:  * No tourniquets in log *  DICTATION: .Other Dictation: Dictation Number -  PLAN OF CARE: Admit to inpatient   PATIENT DISPOSITION:  PACU - hemodynamically stable.   Delay start of Pharmacological VTE agent (>24hrs) due to surgical blood loss or risk of bleeding: no

## 2023-09-14 NOTE — Progress Notes (Signed)
 Seen in PACU.  Doing well.  Pain is controlled.  Right arm is neurovascularly intact.  He has normal motor function of the hand.  He has a weakly palpable pulse in the right radial artery.  Chest tube is dry without airleak.  Will follow closely.  Debby SAILOR. Magda, MD [facility] of [facility] Phone Number: [phone] 09/14/2023 2:30 PM

## 2023-09-14 NOTE — Anesthesia Procedure Notes (Signed)
 Arterial Line Insertion Start/End9/10/2023 8:00 AM Performed by: Lamar Lucie DASEN, CRNA, CRNA  Patient location: Pre-op. Preanesthetic checklist: patient identified, IV checked, site marked, risks and benefits discussed, surgical consent, monitors and equipment checked, pre-op evaluation, timeout performed and anesthesia consent Lidocaine  1% used for infiltration Left, radial was placed Catheter size: 20 G Hand hygiene performed  and maximum sterile barriers used   Attempts: 1 Procedure performed without using ultrasound guided technique. Following insertion, dressing applied and Biopatch. Post procedure assessment: normal and unchanged

## 2023-09-14 NOTE — Anesthesia Procedure Notes (Signed)
 Central Venous Catheter Insertion Performed by: Tilford Franky BIRCH, MD, anesthesiologist Start/End9/10/2023 7:45 AM, 09/14/2023 7:55 AM Patient location: Pre-op. Preanesthetic checklist: patient identified, IV checked, site marked, risks and benefits discussed, surgical consent, monitors and equipment checked, pre-op evaluation, timeout performed and anesthesia consent Position: Trendelenburg Lidocaine  1% used for infiltration and patient sedated Hand hygiene performed , maximum sterile barriers used  and Seldinger technique used Catheter size: 8 Fr Total catheter length 16. Central line was placed.Double lumen Procedure performed using ultrasound guided technique. Ultrasound Notes:anatomy identified, needle tip was noted to be adjacent to the nerve/plexus identified, no ultrasound evidence of intravascular and/or intraneural injection and image(s) printed for medical record Attempts: 1 Following insertion, dressing applied, line sutured and Biopatch. Post procedure assessment: blood return through all ports  Patient tolerated the procedure well with no immediate complications.

## 2023-09-14 NOTE — Anesthesia Procedure Notes (Signed)
 Procedure Name: Intubation Date/Time: 09/14/2023 8:49 AM  Performed by: Tilford Franky BIRCH, MDPre-anesthesia Checklist: Patient identified, Emergency Drugs available, Suction available and Patient being monitored Patient Re-evaluated:Patient Re-evaluated prior to induction Oxygen Delivery Method: Circle system utilized Preoxygenation: Pre-oxygenation with 100% oxygen Induction Type: IV induction Ventilation: Mask ventilation without difficulty Laryngoscope Size: Mac and 4 Grade View: Grade II Tube type: Oral Endobronchial tube: Left and 39 Fr Number of attempts: 1 Airway Equipment and Method: Stylet and Oral airway Placement Confirmation: ETT inserted through vocal cords under direct vision, positive ETCO2 and breath sounds checked- equal and bilateral Secured at: 30 cm Tube secured with: Tape Dental Injury: Teeth and Oropharynx as per pre-operative assessment  Comments: DL x 1 by CRNA, Grade 2 view unable to pass DLT. DLx1 by Dr. Tilford successful intubation with DLT

## 2023-09-14 NOTE — Transfer of Care (Signed)
 Immediate Anesthesia Transfer of Care Note  Patient: Frederick Alexander  Procedure(s) Performed: ROBOT-ASSISTED THORACOSCOPY, TO LIGATE THE ABBERRANT RIGHT SUBCLAVIAN ARTERY (Right: Chest) RIGHT CAROTID SUBCLAVIAN TRANSPOSITION (Right)  Patient Location: PACU  Anesthesia Type:General  Level of Consciousness: drowsy and patient cooperative  Airway & Oxygen Therapy: Patient Spontanous Breathing  Post-op Assessment: Report given to RN, Post -op Vital signs reviewed and stable, and Patient moving all extremities X 4  Post vital signs: Reviewed and stable  Last Vitals:  See PACU flowsheets, VSS before leaving patient   Last Pain:  Vitals:   09/14/23 0710  TempSrc:   PainSc: 0-No pain         Complications: No notable events documented.

## 2023-09-15 ENCOUNTER — Inpatient Hospital Stay (HOSPITAL_COMMUNITY)

## 2023-09-15 ENCOUNTER — Encounter (HOSPITAL_COMMUNITY): Payer: Self-pay | Admitting: Thoracic Surgery (Cardiothoracic Vascular Surgery)

## 2023-09-15 DIAGNOSIS — Z95828 Presence of other vascular implants and grafts: Secondary | ICD-10-CM

## 2023-09-15 DIAGNOSIS — Z48812 Encounter for surgical aftercare following surgery on the circulatory system: Secondary | ICD-10-CM

## 2023-09-15 DIAGNOSIS — Q278 Other specified congenital malformations of peripheral vascular system: Secondary | ICD-10-CM

## 2023-09-15 LAB — CBC
HCT: 29.8 % — ABNORMAL LOW (ref 39.0–52.0)
Hemoglobin: 10.3 g/dL — ABNORMAL LOW (ref 13.0–17.0)
MCH: 31 pg (ref 26.0–34.0)
MCHC: 34.6 g/dL (ref 30.0–36.0)
MCV: 89.8 fL (ref 80.0–100.0)
Platelets: 157 K/uL (ref 150–400)
RBC: 3.32 MIL/uL — ABNORMAL LOW (ref 4.22–5.81)
RDW: 13.9 % (ref 11.5–15.5)
WBC: 11 K/uL — ABNORMAL HIGH (ref 4.0–10.5)
nRBC: 0 % (ref 0.0–0.2)

## 2023-09-15 LAB — BASIC METABOLIC PANEL WITH GFR
Anion gap: 11 (ref 5–15)
BUN: 36 mg/dL — ABNORMAL HIGH (ref 6–20)
CO2: 22 mmol/L (ref 22–32)
Calcium: 8 mg/dL — ABNORMAL LOW (ref 8.9–10.3)
Chloride: 103 mmol/L (ref 98–111)
Creatinine, Ser: 1.49 mg/dL — ABNORMAL HIGH (ref 0.61–1.24)
GFR, Estimated: 56 mL/min — ABNORMAL LOW (ref >60)
Glucose, Bld: 249 mg/dL — ABNORMAL HIGH (ref 70–99)
Potassium: 4.5 mmol/L (ref 3.5–5.1)
Sodium: 136 mmol/L (ref 135–145)

## 2023-09-15 LAB — SURGICAL PATHOLOGY

## 2023-09-15 LAB — GLUCOSE, CAPILLARY
Glucose-Capillary: 219 mg/dL — ABNORMAL HIGH (ref 70–99)
Glucose-Capillary: 233 mg/dL — ABNORMAL HIGH (ref 70–99)
Glucose-Capillary: 233 mg/dL — ABNORMAL HIGH (ref 70–99)
Glucose-Capillary: 157 mg/dL — ABNORMAL HIGH (ref 70–99)

## 2023-09-15 MED ORDER — ENOXAPARIN SODIUM 30 MG/0.3ML IJ SOSY
30.0000 mg | PREFILLED_SYRINGE | INTRAMUSCULAR | Status: DC
  Administered 2023-09-15: 30 mg via SUBCUTANEOUS
  Filled 2023-09-15: qty 0.3

## 2023-09-15 MED ORDER — INSULIN GLARGINE 100 UNIT/ML ~~LOC~~ SOLN: 15.0000 [IU] | Freq: Every day | SUBCUTANEOUS | Status: DC

## 2023-09-15 MED ORDER — INSULIN GLARGINE 100 UNIT/ML ~~LOC~~ SOLN
30.0000 [IU] | Freq: Every day | SUBCUTANEOUS | Status: DC
  Administered 2023-09-15 – 2023-09-16 (×2): 30 [IU] via SUBCUTANEOUS
  Filled 2023-09-15 (×2): qty 0.3

## 2023-09-15 NOTE — Progress Notes (Signed)
 [facility] OF [place] PROGRESS NOTE  ASSESSMENT / PLAN: Frederick Alexander is a 53 y.o. male s/p R carotid - subclavian transposition and VATS with excision of intrathoracic aberrant right subclavian artery.  Doing well from my standpoint. Mobilize as able. Advance diet as directed by Dr. Kerrin. Mild AKI - monitor. Continue BG control. Following closely.  SUBJECTIVE: No distress. No coughing. No difficulty with clears. No R arm pain, numbness, or weakness.   OBJECTIVE: BP 109/76 (BP Location: Right Arm)   Pulse 92   Temp 98.2 F (36.8 C) (Oral)   Resp 15   Ht 5' 11 (1.803 m)   Wt 81.6 kg   SpO2 94%   BMI 25.09 kg/m   Intake/Output Summary (Last 24 hours) at 09/15/2023 0731 Last data filed at 09/15/2023 0600 Gross per 24 hour  Intake 1969.87 ml  Output 1485 ml  Net 484.87 ml    No distress Regular rate and rhythm Unlabored breathing Right neck clean and dry Chest tube without air leak Palpable R radial pulse     Latest Ref Rng & Units 09/15/2023    5:00 AM 09/14/2023   12:03 PM 09/12/2023   12:29 PM  CBC  WBC 4.0 - 10.5 K/uL 11.0   7.6   Hemoglobin 13.0 - 17.0 g/dL 89.6  9.9  87.3   Hematocrit 39.0 - 52.0 % 29.8  29.0  36.0   Platelets 150 - 400 K/uL 157   213         Latest Ref Rng & Units 09/15/2023    5:00 AM 09/14/2023   12:03 PM 09/12/2023   12:29 PM  CMP  Glucose 70 - 99 mg/dL 750  882  854   BUN 6 - 20 mg/dL 36  26  29   Creatinine 0.61 - 1.24 mg/dL 8.50  8.89  8.80   Sodium 135 - 145 mmol/L 136  141  138   Potassium 3.5 - 5.1 mmol/L 4.5  4.1  4.3   Chloride 98 - 111 mmol/L 103  107  102   CO2 22 - 32 mmol/L 22   26   Calcium  8.9 - 10.3 mg/dL 8.0   9.3   Total Protein 6.5 - 8.1 g/dL   7.0   Total Bilirubin 0.0 - 1.2 mg/dL   0.8   Alkaline Phos 38 - 126 U/L   130   AST 15 - 41 U/L   46   ALT 0 - 44 U/L   62     Estimated Creatinine Clearance: 61.1 mL/min (A) (by C-G formula based on SCr of 1.49 mg/dL (H)).  Debby SAILOR. Magda,  MD [facility] of [facility] Phone Number: [phone] 09/15/2023 7:31 AM

## 2023-09-15 NOTE — Discharge Summary (Signed)
 [address] [place] [address]             [phone]      Physician Discharge Summary  Patient ID: Frederick Alexander MRN: [id number] DOB/AGE: 1970-03-16 53 y.o.  Admit date: 09/14/2023 Discharge date: 09/16/2023  Admission Diagnoses:  Patient Active Problem List   Diagnosis Date Noted   Aberrant right subclavian artery 09/15/2023   Elevated liver enzymes 09/13/2023   Poorly controlled type 2 diabetes mellitus with neuropathy (HCC) 08/26/2023   Essential hypertension, benign 08/26/2023   Hypertriglyceridemia 08/26/2023   Throat irritation 09/03/2021   Globus sensation 09/03/2021   Dysphagia 09/03/2021   Diarrhea 09/03/2021   Elevated LFTs 09/03/2021   Abnormal nuclear stress test    Discharge Diagnoses:   Patient Active Problem List   Diagnosis Date Noted   Aberrant right subclavian artery 09/15/2023   S/P video-assisted thoracoscopic surgery (VATS) 09/14/2023   Elevated liver enzymes 09/13/2023   Poorly controlled type 2 diabetes mellitus with neuropathy (HCC) 08/26/2023   Essential hypertension, benign 08/26/2023   Hypertriglyceridemia 08/26/2023   Throat irritation 09/03/2021   Globus sensation 09/03/2021   Dysphagia 09/03/2021   Diarrhea 09/03/2021   Elevated LFTs 09/03/2021   Abnormal nuclear stress test    Discharged Condition: good  History of Present Illness:  Frederick Alexander is a 53 year old man with a history of type 2 diabetes, hyperlipidemia, recent onset hypertension, chronic dysphagia, and an aberrant right subclavian artery.   He has been having difficulty swallowing for about 4 years.  Frequently he Alexander swallow something and then within a few minutes regurgitated back up.  Also has a frequent cough.  Sometimes Alexander cough so much he gets dizzy and has even passed out.  Also complains of palpitations.   At times was attributed to allergies.  He saw a gastroenterologist.  An esophagram showed compression in the upper  thoracic esophagus.  CT angiogram showed an aberrant right subclavian artery.   He saw Dr. Magda from vascular surgery and was referred to Dr. Kerrin for evaluation of combined procedure.  At that time he was evaluated and felt he could undergo robotic assisted thoracoscopy with ligation of aberrant right subclavian artery.  However, he was informed that there could still be other causes of his esophageal/swallowing issues.  The risks and benefits of the procedure were explained to the patient and he was agreeable to proceed.  Hospital Course:  Frederick Alexander presented to [facility] on 09/14/2023.  He was taken to the operating room and underwent Robotic Assisted Right Video Thoracoscopy with resection of right subclavian artery and intercostal nerve blocks.  He underwent Right subclavian artery transposition to the right common carotid artery by Dr. Magda.  He tolerated the procedure without difficulty, was extubated, and taken to the PACU in stable condition.  The patient did well post operatively.  He had some mild discomfort at his chest tube site which is not unexpected.  There was no chest tube output since surgery nor was there evidence of air leak.  This was able to be removed on POD #1.  His blood sugars were elevated and his home regimen of of insulin  was resumed.  He developed a mild elevation in his creatinine level which was felt to be due to anesthesia and lack of fluid intake.  Repeat level was improved at 1.12.  Chest xray was obtained and showed no evidence of pneumothorax.  He  was monitored by Dr. Magda as well and he was felt to be doing well from a vascular standpoint.  He was tolerating intake without difficulty. He is ambulating independently.  His surgical incisions are free from evidence of infection.  He is medically stable for discharge home today.    Consults: vascular surgery  Significant Diagnostic Studies: angiography:   IMPRESSION: 1. Aberrant right  subclavian artery. This is an incidental finding typically of no clinical significance. 2. No evidence of aortic aneurysm or dissection. 3. No acute cardiopulmonary process.     Electronically Signed   By: Wilkie Lent M.D.   On: 12/26/2022 09:32  Treatments: surgery:    NAME: Frederick Alexander, Frederick Alexander MEDICAL RECORD NO: [id number] ACCOUNT NO: [id number] DATE OF BIRTH: Oct 14, 1970 FACILITY: [facility] LOCATION: [facility] PHYSICIAN: Elspeth BROCKS. Kerrin, MD   Operative Report    DATE OF PROCEDURE: 09/14/2023   PREOPERATIVE DIAGNOSES:  1. Aberrant right subclavian artery.  2. Dysphagia lusoria.   POSTOPERATIVE DIAGNOSES:  1. Aberrant right subclavian artery.  2. Dysphagia lusoria.   PROCEDURE:  1. Robotic-assisted right VATS for resection of right subclavian artery. 2. Intercostal nerve blocks, levels 3 through 10.   SURGEON: Elspeth BROCKS. Kerrin, MD   ASSISTANT: Con Clunes, MD   DATE OF SERVICE: 09/14/2023   PATIENT:  Frederick Alexander  53 y.o. 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 N. Magda, MD    ASSISTANT:  Adina Sender, PA-C   An experienced assistant was required given the complexity of this procedure and the standard of surgical care. My assistant helped with exposure through counter tension, suctioning, ligation and retraction to better visualize the surgical field.  My assistant expedited sewing during the case by following my sutures. Wherever I use the term we in the report, my assistant actively helped me with that portion of the procedure.  Discharge Exam: Blood pressure (!) 157/95, pulse 89, temperature 98.3 F (36.8 C), resp. rate 12, height 5' 11 (1.803 m), weight 81.6 kg, SpO2 98%.  General appearance: alert, cooperative, and no distress Heart: regular rate and  rhythm Lungs: clear to auscultation bilaterally Abdomen: soft, non-tender; bowel sounds normal; no masses,  no organomegaly Wound: clean and dry  Discharge disposition: 01-Home or Self Care   Allergies as of 09/16/2023       Reactions   Prednisone Other (See Comments)   Uratic behavior         Medication List     TAKE these medications    acetaminophen  500 MG tablet Commonly known as: TYLENOL  Take 1-2 tablets (500-1,000 mg total) by mouth every 6 (six) hours as needed for mild pain (pain score 1-3) or fever (or temp >/= 101 F).   Aleve 220 MG tablet Generic drug: naproxen sodium Take 220 mg by mouth daily.   amLODipine  10 MG tablet Commonly known as: NORVASC  Take 1 tablet (10 mg total) by mouth daily.   ASPIRIN  81 PO Take 1 tablet by mouth daily.   BASAGLAR  KWIKPEN [place] Inject 30 Units into the skin daily with breakfast.   BEET ROOT PO Take 1 capsule by mouth daily.   cholestyramine  4 GM/DOSE powder Commonly known as: QUESTRAN  Take 1 packet (4 g total) by mouth daily. Mix with 4-6 oz liquid. Take other meds 1 hr before or 4-6 hr  after cholestyramine .   cyanocobalamin 500 MCG tablet Commonly known as: VITAMIN B12 Take 500 mcg by mouth daily.   empagliflozin  25 MG Tabs tablet Commonly known as: Jardiance  Take 1 tablet (25 mg total) by mouth daily before breakfast.   FreeStyle Libre 2 Sensor Misc by Does not apply route.   IRON PO Take 1 tablet by mouth daily.   losartan  25 MG tablet Commonly known as: COZAAR  Take 50 mg by mouth daily. Take 2 tablets to make 50 mg daily   metFORMIN  500 MG 24 hr tablet Commonly known as: GLUCOPHAGE -XR Take 1 tablet (500 mg total) by mouth daily with breakfast.   montelukast 10 MG tablet Commonly known as: SINGULAIR Take 1 tablet by mouth at bedtime.   MULTIVITAMIN MEN 50+ PO Take 1 tablet by mouth daily.   oxyCODONE -acetaminophen  5-325 MG tablet Commonly known as: PERCOCET/ROXICET Take 1 tablet by mouth every 4  (four) hours as needed for moderate pain (pain score 4-6).   pantoprazole  40 MG tablet Commonly known as: PROTONIX  TAKE 1 TABLET BY MOUTH TWICE DAILY BEFORE a meal   vitamin C 250 MG tablet Commonly known as: ASCORBIC ACID Take 250 mg by mouth daily.        Follow-up Information     Rutha Manuelita HERO, PA-C Follow up on 09/27/2023.   Specialties: Physician Assistant, Thoracic Surgery Why: Appointment is at 11:30, please get CXR at 10:30 on 2nd floor of our office building prior to your appointment at our office Contact information: [address], [place] [address] [phone]                 Signed: Rocky Shad, PA-C  09/16/2023, 7:36 AM

## 2023-09-15 NOTE — Progress Notes (Addendum)
      [address] [place] [employer] [address]             [phone]         1 Day Post-Op Procedure(s) (LRB): ROBOT-ASSISTED THORACOSCOPY, TO LIGATE THE ABBERRANT RIGHT SUBCLAVIAN ARTERY (Right) RIGHT CAROTID SUBCLAVIAN TRANSPOSITION (Right)  Subjective:  Patient states he has pain at his chest tube site.  He also states he got some dizziness when he first got up.  Denies N/V.  Objective: Vital signs in last 24 hours: Temp:  [97.8 F (36.6 C)-98.4 F (36.9 C)] 98.1 F (36.7 C) (09/11 0700) Pulse Rate:  [75-107] 92 (09/11 0600) Cardiac Rhythm: Normal sinus rhythm;Sinus tachycardia (09/10 2000) Resp:  [5-21] 15 (09/11 0600) BP: (107-141)/(66-96) 109/76 (09/11 0515) SpO2:  [91 %-100 %] 94 % (09/11 0600) Arterial Line BP: (91-178)/(42-75) 117/55 (09/11 0500) Weight:  [81.6 kg] 81.6 kg (09/11 0600)  Intake/Output from previous day: 09/10 0701 - 09/11 0700 In: 1969.9 [P.O.:570; I.V.:1000; IV Piggyback:399.9] Out: 1485 [Urine:1355; Blood:130]  General appearance: alert, cooperative, and no distress Heart: regular rate and rhythm Lungs: clear to auscultation bilaterally Abdomen: soft, non-tender; bowel sounds normal; no masses,  no organomegaly Wound: clean and dry  Lab Results: Recent Labs    09/12/23 1229 09/14/23 1203 09/15/23 0500  WBC 7.6  --  11.0*  HGB 12.6* 9.9* 10.3*  HCT 36.0* 29.0* 29.8*  PLT 213  --  157   BMET:  Recent Labs    09/12/23 1229 09/14/23 1203 09/15/23 0500  NA 138 141 136  K 4.3 4.1 4.5  CL 102 107 103  CO2 26  --  22  GLUCOSE 145* 117* 249*  BUN 29* 26* 36*  CREATININE 1.19 1.10 1.49*  CALCIUM  9.3  --  8.0*    PT/INR:  Recent Labs    09/14/23 0704  LABPROT 12.5  INR 0.9   ABG    Component Value Date/Time   PHART 7.381 03/25/2017 0846   HCO3 28.1 (H) 03/25/2017 0846   TCO2 25 09/14/2023 1203   O2SAT 99.0 03/25/2017 0846   CBG (last 3)  Recent Labs    09/14/23 1631 09/14/23 2123 09/15/23 0602   GLUCAP 127* 301* 219*    Assessment/Plan: S/P Procedure(s) (LRB): ROBOT-ASSISTED THORACOSCOPY, TO LIGATE THE ABBERRANT RIGHT SUBCLAVIAN ARTERY (Right) RIGHT CAROTID SUBCLAVIAN TRANSPOSITION (Right)  CV- NSR, H/O HTN- home Norvasc  resumed Pulm- CT on water seal, no drainage since surgery, no air leak present.. CXR w/o effusion/pneumothorax.. mild bilateral atelectasis will d/c chest tube today Renal- creatinine slightly increased from baseline to 1.49 likely due to anesthesia, decreased fluid intake.. will monitor DM- sugars elevated, will continue SSIP and resume home Insulin  at 15 units can titrate back to 30 as sugars allow, will hold off on Metformin , Jardiance  for now Lovenox  for DVT prophylaxis D/C Central Line Dispo- patient stable, overall doing well will transfer to [facility]    LOS: 1 day    Rocky Shad, PA-C 09/15/2023 8:03 AM Patient seen and examined, agree with above Looks great Will dc chest tube Advance diet Monitor renal function  Elspeth C. Kerrin, MD [facility] [phone]

## 2023-09-15 NOTE — Plan of Care (Signed)
Discussed with patient plan of care for the evening, pain management and medications with some teach back displayed.  Problem: Health Behavior/Discharge Planning: Goal: Ability to manage health-related needs will improve Outcome: Progressing   Problem: Education: Goal: Knowledge of General Education information will improve Description: Including pain rating scale, medication(s)/side effects and non-pharmacologic comfort measures Outcome: Progressing

## 2023-09-15 NOTE — Hospital Course (Addendum)
 History of Present Illness:  Frederick Alexander is a 53 year old man with a history of type 2 diabetes, hyperlipidemia, recent onset hypertension, chronic dysphagia, and an aberrant right subclavian artery.   He has been having difficulty swallowing for about 4 years.  Frequently he will swallow something and then within a few minutes regurgitated back up.  Also has a frequent cough.  Sometimes will cough so much he gets dizzy and has even passed out.  Also complains of palpitations.   At times was attributed to allergies.  He saw a gastroenterologist.  An esophagram showed compression in the upper thoracic esophagus.  CT angiogram showed an aberrant right subclavian artery.   He saw Dr. Magda from vascular surgery and was referred to Dr. Kerrin for evaluation of combined procedure.  At that time he was evaluated and felt he could undergo robotic assisted thoracoscopy with ligation of aberrant right subclavian artery.  However, he was informed that there could still be other causes of his esophageal/swallowing issues.  The risks and benefits of the procedure were explained to the patient and he was agreeable to proceed.  Hospital Course:  Frederick Alexander presented to [facility] on 09/14/2023.  He was taken to the operating room and underwent Robotic Assisted Right Video Thoracoscopy with resection of right subclavian artery and intercostal nerve blocks.  He underwent Right subclavian artery transposition to the right common carotid artery by Dr. Magda.  He tolerated the procedure without difficulty, was extubated, and taken to the PACU in stable condition.  The patient did well post operatively.  He had some mild discomfort at his chest tube site which is not unexpected.  There was no chest tube output since surgery nor was there evidence of air leak.  This was able to be removed on POD #1.  His blood sugars were elevated and his home regimen of of insulin  was resumed.  He developed a mild elevation  in his creatinine level which was felt to be due to anesthesia and lack of fluid intake.  Repeat level was improved at 1.12.  Chest xray was obtained and showed no evidence of pneumothorax.  He was monitored by Dr. Magda as well and he was felt to be doing well from a vascular standpoint.  He was tolerating intake without difficulty. He is ambulating independently.  His surgical incisions are free from evidence of infection.  He is medically stable for discharge home today.

## 2023-09-15 NOTE — Op Note (Signed)
 Frederick, Alexander MEDICAL RECORD NO: [id number] ACCOUNT NO: [id number] DATE OF BIRTH: 01/14/1970 FACILITY: [facility] LOCATION: [facility] PHYSICIAN: Elspeth BROCKS. Kerrin, MD  Operative Report   DATE OF PROCEDURE: 09/14/2023  PREOPERATIVE DIAGNOSES:  1. Aberrant right subclavian artery.  2. Dysphagia lusoria.  POSTOPERATIVE DIAGNOSES:  1. Aberrant right subclavian artery.  2. Dysphagia lusoria.  PROCEDURE:  1. Robotic-assisted right VATS for resection of right subclavian artery. 2. Intercostal nerve blocks, levels 3 through 10.  SURGEON: Elspeth BROCKS. Kerrin, MD  ASSISTANT: Con Clunes, MD  ANESTHESIA: General.  FINDINGS: Aberrant right subclavian artery noted coursing posterior to esophagus.  CLINICAL NOTE: Frederick Alexander is a 53 year old gentleman who presented with persistent dysphagia. He ultimately was found to have an aberrant right subclavian artery with esophageal compression. He was referred for surgical intervention. The plan was for a combined procedure with Dr. Debby Robertson of Vascular Surgery to perform subclavian transposition to the right common carotid artery and then a robotic approach to resect the residual aberrant right subclavian artery. The patient was advised of the  indications, risks, benefits, and alternatives. He understood and accepted the risks and agreed to proceed.  OPERATIVE NOTE: Experienced assistance was necessary for this case due to surgical complexity. Dr. Clunes assisted with port placement, robot docking and undocking, instrument exchange, specimen retrieval, suctioning, and wound closure.  Frederick Alexander was brought to the operating room on 09/14/2023. He had induction of general anesthesia and was intubated with a double-lumen endotracheal tube. Intravenous antibiotics were administered. A Foley catheter was placed. Sequential compression devices were placed on the calves for DVT prophylaxis. A Bair Hugger was placed for active warming. Dr. Debby Robertson then  performed a right subclavian artery transposition to the right common carotid artery and ligated the proximal stump of the artery.   After the wound was closed and dressed, the patient was repositioned into a left lateral decubitus position. Single lung ventilation of the left lung was initiated and was tolerated well throughout the procedure. A Bair Hugger was placed and then the right chest was prepped and draped in the usual sterile fashion.  A second time-out was performed. A solution containing 20 mL of liposomal bupivacaine , 30 mL of 0.5% bupivacaine , and 50 mL of saline was prepared. The solution was used for local at the incision sites as well as for the intercostal nerve blocks. An incision was made in the eighth interspace in approximately the midaxillary line. An 8-mm robotic port was inserted. The thoracoscope was advanced into the chest. After confirming intrapleural placement, carbon dioxide was insufflated per protocol. A  second robotic port was placed anterior to the camera port. Intercostal nerve blocks then were performed from the third to the 10th interspace by injecting 10 mL of bupivacaine  solution into a subpleural plane at each level. A 12-mm Airseal port was placed in the 10th interspace posterolaterally. Two additional eighth interspace robotic ports were placed. One was an 8-mm port and the other was a 12-mm port. The robot was deployed. The camera arm was docked. Targeting was performed. The remaining arms were docked. The robotic instruments were inserted with thoracoscopic visualization. The lung was retracted inferiorly. The pleura was incised over the mediastinum superior to the azygos vein. The trachea was identified. The phrenic and vagus nerves were identified and no cautery was used in their vicinity. Dissecting out posterior to the trachea, the right subclavian was identified. Initially, it was unclear if this was the artery or the esophagus. Ultimately, the esophagus was  identified and then the artery was dissected out. As the artery was dissected out, the distal end, which previously had been ligated by Dr. Magda, was brought down into the field. Tissue was mobilized. Care was taken not to place excessive traction on the artery. The aberrant subclavian artery was divided near its origin from the aorta using a robotic stapler with a vascular cartridge. The specimen was removed and sent for permanent pathology. A small piece of thin Gore-Tex then was placed and clipped to the mediastinal tissue to cover the staple line and protect the esophagus. The sponges and vessel loop used during the procedure were removed. A 28-French chest tube was placed through the anterior port incision and secured. Dual lung ventilation was resumed. The ports were removed. The wounds were closed in standard fashion. The chest tube was placed to a Pleur-evac on water seal. The patient was placed back in the supine position. He was extubated in the operating room and taken to the post-anesthetic care unit in good condition. All sponge, needle, and instrument counts were correct at the end of the procedure.    SUJ D: 09/14/2023 4:15:00 pm T: 09/15/2023 12:18:00 am  JOB: [id number]/ [id number]

## 2023-09-15 NOTE — Plan of Care (Signed)

## 2023-09-15 NOTE — TOC CM/SW Note (Signed)
 Transition of Care [facility]) - Inpatient Brief Assessment   Patient Details  Name: Frederick Alexander MRN: [id number] Date of Birth: 1970/06/03  Transition of Care [facility]) CM/SW Contact:    Sudie Erminio Deems, RN Phone Number: 09/15/2023, 1:07 PM   Clinical Narrative: Patient s/p R carotid - subclavian transposition and VATS. PTA patient was independent from home with spouse. Patient does not use any DME in the home. Patient has insurance and PCP-no issues with transportation or medications. Inpatient Case Manager will continue to follow for additional disposition needs as the patient progresses.    Transition of Care Asessment: Insurance and Status: Insurance coverage has been reviewed Patient has primary care physician: Yes Home environment has been reviewed: reviewed Prior level of function:: independent Prior/Current Home Services: No current home services Social Drivers of Health Review: SDOH reviewed no interventions necessary Readmission risk has been reviewed: Yes Transition of care needs: no transition of care needs at this time

## 2023-09-15 NOTE — Inpatient Diabetes Management (Signed)
 Inpatient Diabetes Program Recommendations  AACE/ADA: New Consensus Statement on Inpatient Glycemic Control (2015)  Target Ranges:  Prepandial:   less than 140 mg/dL      Peak postprandial:   less than 180 mg/dL (1-2 hours)      Critically ill patients:  140 - 180 mg/dL   Lab Results  Component Value Date   GLUCAP 219 (H) 09/15/2023   HGBA1C 9.1 (H) 09/14/2023    Review of Glycemic Control  Latest Reference Range & Units 09/14/23 06:47 09/14/23 13:17 09/14/23 16:31 09/14/23 21:23 09/15/23 06:02  Glucose-Capillary 70 - 99 mg/dL 894 (H) 874 (H) 872 (H) 301 (H) 219 (H)   Diabetes history: DM 2 Outpatient Diabetes medications:  Jardiance  25 mg daily Basaglar  30 units daily  Metformin  500 mg daily Current orders for Inpatient glycemic control:  Novolog  0-15 units tid with meals Lantus  30 units daily Inpatient Diabetes Program Recommendations:   Agree with restart of Lantus .  Patient also received Decadron  5 mg x1 yesterday which likely contributed to elevated blood sugars.   Thanks,  Randall Bullocks, RN, BC-ADM Inpatient Diabetes Coordinator Pager [phone]  (8a-5p)

## 2023-09-16 ENCOUNTER — Other Ambulatory Visit (HOSPITAL_COMMUNITY): Payer: Self-pay

## 2023-09-16 ENCOUNTER — Inpatient Hospital Stay (HOSPITAL_COMMUNITY)

## 2023-09-16 LAB — TYPE AND SCREEN
ABO/RH(D): O POS
Antibody Screen: NEGATIVE
Unit division: 0
Unit division: 0
Unit division: 0
Unit division: 0

## 2023-09-16 LAB — BASIC METABOLIC PANEL WITH GFR
Anion gap: 10 (ref 5–15)
BUN: 32 mg/dL — ABNORMAL HIGH (ref 6–20)
CO2: 26 mmol/L (ref 22–32)
Calcium: 8.1 mg/dL — ABNORMAL LOW (ref 8.9–10.3)
Chloride: 103 mmol/L (ref 98–111)
Creatinine, Ser: 1.12 mg/dL (ref 0.61–1.24)
GFR, Estimated: >60 mL/min (ref >60)
Glucose, Bld: 185 mg/dL — ABNORMAL HIGH (ref 70–99)
Potassium: 4.1 mmol/L (ref 3.5–5.1)
Sodium: 139 mmol/L (ref 135–145)

## 2023-09-16 LAB — BPAM RBC
Blood Product Expiration Date: 202510042359
Blood Product Expiration Date: 202510042359
Blood Product Expiration Date: 202510042359
Blood Product Expiration Date: 202510042359
ISSUE DATE / TIME: 202509100940
ISSUE DATE / TIME: 202509100940
Unit Type and Rh: 5100
Unit Type and Rh: 5100
Unit Type and Rh: 5100
Unit Type and Rh: 5100

## 2023-09-16 LAB — GLUCOSE, CAPILLARY: Glucose-Capillary: 167 mg/dL — ABNORMAL HIGH (ref 70–99)

## 2023-09-16 MED ORDER — ACETAMINOPHEN 500 MG PO TABS: 500.0000 mg | ORAL_TABLET | Freq: Four times a day (QID) | ORAL | Status: AC | PRN

## 2023-09-16 MED ORDER — OXYCODONE-ACETAMINOPHEN 5-325 MG PO TABS
1.0000 | ORAL_TABLET | ORAL | 0 refills | Status: DC | PRN
  Filled 2023-09-16: qty 30, 5d supply, fill #0

## 2023-09-16 NOTE — Plan of Care (Signed)
 Problem: Education: Goal: Knowledge of General Education information will improve Description: Including pain rating scale, medication(s)/side effects and non-pharmacologic comfort measures Outcome: Adequate for Discharge   Problem: Health Behavior/Discharge Planning: Goal: Ability to manage health-related needs will improve Outcome: Adequate for Discharge   Problem: Clinical Measurements: Goal: Ability to maintain clinical measurements within normal limits will improve Outcome: Adequate for Discharge Goal: Will remain free from infection Outcome: Adequate for Discharge Goal: Diagnostic test results will improve Outcome: Adequate for Discharge Goal: Respiratory complications will improve Outcome: Adequate for Discharge Goal: Cardiovascular complication will be avoided Outcome: Adequate for Discharge   Problem: Activity: Goal: Risk for activity intolerance will decrease Outcome: Adequate for Discharge   Problem: Nutrition: Goal: Adequate nutrition will be maintained Outcome: Adequate for Discharge   Problem: Coping: Goal: Level of anxiety will decrease Outcome: Adequate for Discharge   Problem: Elimination: Goal: Will not experience complications related to bowel motility Outcome: Adequate for Discharge Goal: Will not experience complications related to urinary retention Outcome: Adequate for Discharge   Problem: Pain Managment: Goal: General experience of comfort will improve and/or be controlled Outcome: Adequate for Discharge   Problem: Safety: Goal: Ability to remain free from injury will improve Outcome: Adequate for Discharge   Problem: Skin Integrity: Goal: Risk for impaired skin integrity will decrease Outcome: Adequate for Discharge   Problem: Education: Goal: Ability to describe self-care measures that may prevent or decrease complications (Diabetes Survival Skills Education) will improve Outcome: Adequate for Discharge Goal: Individualized Educational  Video(s) Outcome: Adequate for Discharge   Problem: Coping: Goal: Ability to adjust to condition or change in health will improve Outcome: Adequate for Discharge   Problem: Fluid Volume: Goal: Ability to maintain a balanced intake and output will improve Outcome: Adequate for Discharge   Problem: Health Behavior/Discharge Planning: Goal: Ability to identify and utilize available resources and services will improve Outcome: Adequate for Discharge Goal: Ability to manage health-related needs will improve Outcome: Adequate for Discharge   Problem: Metabolic: Goal: Ability to maintain appropriate glucose levels will improve Outcome: Adequate for Discharge   Problem: Nutritional: Goal: Maintenance of adequate nutrition will improve Outcome: Adequate for Discharge Goal: Progress toward achieving an optimal weight will improve Outcome: Adequate for Discharge   Problem: Skin Integrity: Goal: Risk for impaired skin integrity will decrease Outcome: Adequate for Discharge   Problem: Tissue Perfusion: Goal: Adequacy of tissue perfusion will improve Outcome: Adequate for Discharge   Problem: Education: Goal: Knowledge of discharge needs will improve Outcome: Adequate for Discharge   Problem: Clinical Measurements: Goal: Postoperative complications will be avoided or minimized Outcome: Adequate for Discharge   Problem: Respiratory: Goal: Will achieve and/or maintain a regular respiratory rate, without signs or symptoms of dyspnea Outcome: Adequate for Discharge   Problem: Skin Integrity: Goal: Demonstration of wound healing without infection will improve Outcome: Adequate for Discharge   Problem: Education: Goal: Knowledge of disease or condition will improve Outcome: Adequate for Discharge Goal: Knowledge of the prescribed therapeutic regimen will improve Outcome: Adequate for Discharge   Problem: Activity: Goal: Risk for activity intolerance will decrease Outcome:  Adequate for Discharge   Problem: Cardiac: Goal: Will achieve and/or maintain hemodynamic stability Outcome: Adequate for Discharge   Problem: Clinical Measurements: Goal: Postoperative complications will be avoided or minimized Outcome: Adequate for Discharge   Problem: Respiratory: Goal: Respiratory status will improve Outcome: Adequate for Discharge   Problem: Pain Management: Goal: Pain level will decrease Outcome: Adequate for Discharge   Problem: Skin Integrity: Goal: Wound healing  without signs and symptoms infection will improve Outcome: Adequate for Discharge

## 2023-09-16 NOTE — Progress Notes (Addendum)
  Progress Note    09/16/2023 8:06 AM 2 Days Post-Op  Subjective:  Tolerated regular diet last night.  Feeling ready for discharge home   Vitals:   09/16/23 0617 09/16/23 0751  BP: (!) 157/95 (!) 150/82  Pulse: 89 (!) 104  Resp: 12 15  Temp: 98.3 F (36.8 C) 98.2 F (36.8 C)  SpO2: 98% 95%   Physical Exam: Lungs:  non labored Incisions:  R neck c/d/i Extremities:  palpable R radial pulse Neurologic: CN grossly intact  CBC    Component Value Date/Time   WBC 11.0 (H) 09/15/2023 0500   RBC 3.32 (L) 09/15/2023 0500   HGB 10.3 (L) 09/15/2023 0500   HCT 29.8 (L) 09/15/2023 0500   PLT 157 09/15/2023 0500   MCV 89.8 09/15/2023 0500   MCH 31.0 09/15/2023 0500   MCHC 34.6 09/15/2023 0500   RDW 13.9 09/15/2023 0500    BMET    Component Value Date/Time   NA 139 09/16/2023 0410   K 4.1 09/16/2023 0410   CL 103 09/16/2023 0410   CO2 26 09/16/2023 0410   GLUCOSE 185 (H) 09/16/2023 0410   BUN 32 (H) 09/16/2023 0410   BUN 36 (A) 07/05/2023 0000   CREATININE 1.12 09/16/2023 0410   CALCIUM  8.1 (L) 09/16/2023 0410   GFRNONAA >60 09/16/2023 0410   GFRAA >60 10/01/2009 0000    INR    Component Value Date/Time   INR 0.9 09/14/2023 0704     Intake/Output Summary (Last 24 hours) at 09/16/2023 0806 Last data filed at 09/16/2023 0500 Gross per 24 hour  Intake 700 ml  Output 402 ml  Net 298 ml     Assessment/Plan:  53 y.o. male is s/p R carotid subclavian transposition 2 Days Post-Op   Subjectively feeling well.  Tolerated a regular diet yesterday.  R UE well perfused.  No neuro events overnight.  Continue aspirin  daily.  Ok for discharge from vascular standpoint.  Office will call patient to arrange follow up.   Donnice Sender, PA-C [facility] [phone] 09/16/2023 8:06 AM  VASCULAR STAFF ADDENDUM: I have independently interviewed and examined the patient. I agree with the above.  Safe for discharge from my standpoint. Plan carotid duplex  in 4 weeks during follow up with me.   Debby SAILOR. Magda, MD [facility] Phone Number: [phone] 09/16/2023 9:49 AM

## 2023-09-16 NOTE — Progress Notes (Signed)
      [address] [place] [address]             [phone]         2 Days Post-Op Procedure(s) (LRB): ROBOT-ASSISTED THORACOSCOPY, TO LIGATE THE ABBERRANT RIGHT SUBCLAVIAN ARTERY (Right) RIGHT CAROTID SUBCLAVIAN TRANSPOSITION (Right)  Subjective:  Patient doing well.  He was able to eat a cheeseburger last night without difficulty.  Remain sore along his right chest tube states the pain has improved since tube was removed.  Objective: Vital signs in last 24 hours: Temp:  [97.6 F (36.4 C)-98.7 F (37.1 C)] 98.3 F (36.8 C) (09/12 0617) Pulse Rate:  [80-108] 89 (09/12 0617) Cardiac Rhythm: Sinus tachycardia (09/11 1906) Resp:  [8-21] 12 (09/12 0617) BP: (91-157)/(65-95) 157/95 (09/12 0617) SpO2:  [84 %-99 %] 98 % (09/12 0617)  Intake/Output from previous day: 09/11 0701 - 09/12 0700 In: 700 [P.O.:660; I.V.:40] Out: 410 [Urine:400; Chest Tube:10]  General appearance: alert, cooperative, and no distress Heart: regular rate and rhythm Lungs: clear to auscultation bilaterally Abdomen: soft, non-tender; bowel sounds normal; no masses,  no organomegaly Wound: clean and dry  Lab Results: Recent Labs    09/14/23 1203 09/15/23 0500  WBC  --  11.0*  HGB 9.9* 10.3*  HCT 29.0* 29.8*  PLT  --  157   BMET:  Recent Labs    09/15/23 0500 09/16/23 0410  NA 136 139  K 4.5 4.1  CL 103 103  CO2 22 26  GLUCOSE 249* 185*  BUN 36* 32*  CREATININE 1.49* 1.12  CALCIUM  8.0* 8.1*    PT/INR:  Recent Labs    09/14/23 0704  LABPROT 12.5  INR 0.9   ABG    Component Value Date/Time   PHART 7.381 03/25/2017 0846   HCO3 28.1 (H) 03/25/2017 0846   TCO2 25 09/14/2023 1203   O2SAT 99.0 03/25/2017 0846   CBG (last 3)  Recent Labs    09/15/23 1621 09/15/23 2107 09/16/23 0619  GLUCAP 233* 157* 167*    Assessment/Plan: S/P Procedure(s) (LRB): ROBOT-ASSISTED THORACOSCOPY, TO LIGATE THE ABBERRANT RIGHT SUBCLAVIAN ARTERY (Right) RIGHT CAROTID  SUBCLAVIAN TRANSPOSITION (Right)  CV- NSR, H/O HTN- will resume all medications today Pulm- no issues, not requiring oxygen.SABRA CXR w/o pneumothorax, effusion post chest tube removal Renal- creatinine improved, down to 1.12 DM- sugars remain elevated at times, will resume Meformin and Jardiance , continue insulin   Dispo- patient doing well will d/c home today   LOS: 2 days    Rocky Shad, PA-C 09/16/2023 7:26 AM

## 2023-09-16 NOTE — TOC Transition Note (Signed)
 Transition of Care [facility]) - Discharge Note   Patient Details  Name: Frederick Alexander MRN: [id number] Date of Birth: 1970/09/14  Transition of [facility] CM/SW Contact:  Roxie KANDICE Stain, RN Phone Number: 09/16/2023, 8:48 AM   Clinical Narrative:    Frederick Alexander is stable to discharge home. Follow up apt on [facility]. No ICM (Inpatient Care Management) needs at this time.    Final next level of care: Home/Self Care Barriers to Discharge: Barriers Resolved   Patient Goals and CMS Choice Patient states their goals for this hospitalization and ongoing recovery are:: return home          Discharge Placement             home          Discharge Plan and Services Additional resources added to the After Visit Summary for                                       Social Drivers of Health (SDOH) Interventions SDOH Screenings   Food Insecurity: No Food Insecurity (09/14/2023)  Housing: Low Risk  (09/14/2023)  Transportation Needs: No Transportation Needs (09/14/2023)  Utilities: Not At Risk (09/14/2023)  Tobacco Use: Low Risk  (09/14/2023)     Readmission Risk Interventions    09/16/2023    8:46 AM  Readmission Risk Prevention Plan  Post Dischage Appt Complete  Medication Screening Complete  Transportation Screening Complete

## 2023-09-16 NOTE — Progress Notes (Signed)
 Green not yellow

## 2023-09-26 ENCOUNTER — Other Ambulatory Visit: Payer: Self-pay | Admitting: Thoracic Surgery (Cardiothoracic Vascular Surgery)

## 2023-09-26 DIAGNOSIS — Z9889 Other specified postprocedural states: Secondary | ICD-10-CM

## 2023-09-27 ENCOUNTER — Ambulatory Visit
Admission: RE | Admit: 2023-09-27 | Discharge: 2023-09-27 | Disposition: A | Discharge status: Home / Self Care | Source: Ambulatory Visit | Attending: Internal Medicine | Admitting: Internal Medicine

## 2023-09-27 ENCOUNTER — Ambulatory Visit (INDEPENDENT_AMBULATORY_CARE_PROVIDER_SITE_OTHER): Payer: Self-pay

## 2023-09-27 VITALS — BP 165/82 | HR 89 | Resp 20 | Ht 71.0 in | Wt 180.0 lb

## 2023-09-27 DIAGNOSIS — Q278 Other specified congenital malformations of peripheral vascular system: Secondary | ICD-10-CM | POA: Diagnosis present

## 2023-09-27 DIAGNOSIS — Z9889 Other specified postprocedural states: Secondary | ICD-10-CM

## 2023-09-27 MED ORDER — BENZONATATE 200 MG PO CAPS: 200.0000 mg | ORAL_CAPSULE | Freq: Three times a day (TID) | ORAL | 0 refills | Status: DC | PRN

## 2023-09-27 NOTE — Patient Instructions (Signed)
 Continue follow up with vascular surgery as scheduled

## 2023-09-27 NOTE — Progress Notes (Signed)
 [address] [place] [address]             [phone]       HPI:  Frederick Alexander is a 53 year old man with medical history of hypertension, aberrant right subclavian artery, type 2 diabetes, dysphagia lusoria, and cough who returns for routine postoperative follow-up having undergone Robotic-assisted right VATS for resection of right subclavian artery and intercostal nerve blocks, levels 3 through 10 on 09/14/2023 with Dr. Kerrin. He underwent Right subclavian artery transposition to the right common carotid artery at the same time by Dr. Magda.   The patient's early postoperative recovery while in the hospital was unremarkable. He was stable for discharge on 09/16/2023.  Since hospital discharge the patient reports that his dysphagia has improved.  He has been able to swallow without difficulty and no regurgitation.  The cough has not improved since surgery. The cough is consistent and worsens when he turns his head to the left and the right.  He has been attempting to use dayquil and nyquil for the cough which does help. He describes that he has a tickle in his throat and once the coughing starts he has a hard time stopping.   He has had some incisional pain which is taking tylenol  for and is helping.  He is no longer taking oxycodone  for pain due to it making him very drowsy. He has been using a pillow to help brace his right side for coughing. He denies chest pain, shortness of breath and wheezing.     Allergies as of 09/27/2023       Reactions   Prednisone Other (See Comments)   Uratic behavior         Medication List        Accurate as of September 27, 2023 11:50 AM. If you have any questions, ask your nurse or doctor.          acetaminophen  500 MG tablet Commonly known as: TYLENOL  Take 1-2 tablets (500-1,000 mg total) by mouth every 6 (six) hours as needed for mild pain (pain score 1-3) or fever (or temp >/= 101 F).   Aleve 220 MG  tablet Generic drug: naproxen sodium Take 220 mg by mouth daily.   amLODipine  10 MG tablet Commonly known as: NORVASC  Take 1 tablet (10 mg total) by mouth daily.   ASPIRIN  81 PO Take 1 tablet by mouth daily.   BASAGLAR  KWIKPEN [place] Inject 30 Units into the skin daily with breakfast.   BEET ROOT PO Take 1 capsule by mouth daily.   cholestyramine  4 GM/DOSE powder Commonly known as: QUESTRAN  Take 1 packet (4 g total) by mouth daily. Mix with 4-6 oz liquid. Take other meds 1 hr before or 4-6 hr after cholestyramine .   cyanocobalamin 500 MCG tablet Commonly known as: VITAMIN B12 Take 500 mcg by mouth daily.   empagliflozin  25 MG Tabs tablet Commonly known as: Jardiance  Take 1 tablet (25 mg total) by mouth daily before breakfast.   FreeStyle Libre 2 Sensor Misc by Does not apply route.   IRON PO Take 1 tablet by mouth daily.   losartan  25 MG tablet Commonly known as: COZAAR  Take 50 mg by mouth daily. Take 2 tablets to make 50 mg daily   metFORMIN  500 MG 24 hr tablet Commonly known as: GLUCOPHAGE -XR Take 1 tablet (500 mg total) by mouth daily with breakfast.   montelukast 10 MG tablet Commonly known as:  SINGULAIR Take 1 tablet by mouth at bedtime.   MULTIVITAMIN MEN 50+ PO Take 1 tablet by mouth daily.   oxyCODONE -acetaminophen  5-325 MG tablet Commonly known as: PERCOCET/ROXICET Take 1 tablet by mouth every 4 (four) hours as needed for moderate pain (pain score 4-6).   pantoprazole  40 MG tablet Commonly known as: PROTONIX  TAKE 1 TABLET BY MOUTH TWICE DAILY BEFORE a meal   vitamin C 250 MG tablet Commonly known as: ASCORBIC ACID Take 250 mg by mouth daily.         ROS Review of Systems  Constitutional:  Negative for chills, fever and malaise/fatigue.  Respiratory:  Positive for cough. Negative for shortness of breath and wheezing.   Cardiovascular: Negative.  Negative for chest pain and leg swelling.      BP (!) 165/82   Pulse 89   Resp 20   Ht 5'  11 (1.803 m)   Wt 180 lb (81.6 kg)   SpO2 97% Comment: RA  BMI 25.10 kg/m    Physical Exam Constitutional:      Appearance: Normal appearance.  HENT:     Head: Normocephalic and atraumatic.  Cardiovascular:     Rate and Rhythm: Normal rate and regular rhythm.     Heart sounds: Normal heart sounds, S1 normal and S2 normal.  Pulmonary:     Effort: Pulmonary effort is normal.     Breath sounds: Normal breath sounds.  Skin:    General: Skin is warm and dry.      Neurological:     General: No focal deficit present.     Mental Status: He is alert and oriented to person, place, and time.      Imaging: CLINICAL DATA:  History of right VATS for ligation of aberrant right subclavian artery.   EXAM: CHEST - 2 VIEW   COMPARISON:  09/16/2023   FINDINGS: The heart size and mediastinal contours are within normal limits. Improved aeration bilaterally, especially at the right lung base since the prior chest x-ray. There is no evidence of pulmonary edema, consolidation, pneumothorax, nodule or pleural fluid. The visualized skeletal structures are unremarkable.   IMPRESSION: Improved aeration bilaterally, especially at the right lung base since the prior chest x-ray.     Electronically Signed   By: Marcey Moan M.D.   On: 09/27/2023 11:45   Assessment/Plan: S/P video-assisted thoracoscopic surgery (VATS) -We reviewed today's chest x ray. He should continue to keep incision sites clean and dry.  He can continue to use tylenol  for pain.  He is to increase is exercise/activity as tolerated but still should not lift more than 10 pounds for the next couple weeks.   -Discussed chronic cough.  He is to resume zyrtec and pantoprazole  at this time.  Sent benzonatate  TID for him at this time to hopefully lessen cough while recovering from surgery. Continue to stay hydrated.  Can use spoonful of honey to help with cough suppression.  If cough persists then he should follow up with his  PCP for further evaluation -Continue follow up with vascular surgery as scheduled     Manuelita CHRISTELLA Rough, PA-C 11:50 AM 09/27/23

## 2023-10-18 ENCOUNTER — Encounter: Payer: Self-pay | Admitting: Vascular Surgery

## 2023-10-18 ENCOUNTER — Ambulatory Visit: Attending: Vascular Surgery | Admitting: Physician Assistant

## 2023-10-18 VITALS — BP 167/98 | HR 90 | Temp 98.2°F | Resp 18 | Ht 71.0 in | Wt 180.3 lb

## 2023-10-18 DIAGNOSIS — Q278 Other specified congenital malformations of peripheral vascular system: Secondary | ICD-10-CM

## 2023-10-18 DIAGNOSIS — R053 Chronic cough: Secondary | ICD-10-CM

## 2023-10-18 NOTE — Progress Notes (Signed)
 POST OPERATIVE OFFICE NOTE    CC:  F/u for surgery  HPI:  This is a 53 y.o. male who is s/p Right subclavian artery transposition to right common carotid artery on 09/14/23 by Dr. Magda. This was performed due to Aberrant right subclavian artery causing dysphagia lusoria. At the same time he had robotic-assisted right VATS for resection of right subclavian artery and intercostal nerve blocks, levels 3 through 10 on 09/14/2023 with Dr. Kerrin.   Pt returns today for follow up with his wife.  Pt states since discharge his dysphagia has improved. Tolerating liquids and solids without issues. He however has a persistent cough that has not resolved. He says at times this is embarrassing because he cannot stop coughing and has to continuously clear his throat. He says sometimes he coughs so much he vomits. He says movement of his head sometimes causes tickle in his throat to start. Often occurs with eating spicier foods but also can occur just out of the blue when he is not eating. He also occasionally coughs up thick phlegm. This is very bothersome to him.  He otherwise denies chest pain, shortness of breath or wheezing. His incisions along his right flank are still very sore but they are getting better. He reports no issues with his neck incision. He says that his blood pressure has improved since his surgery. He was seen on 09/27/23 by CT surgery for post operative follow up and he is doing well with recovery from their standpoint. He was given prescription for Benzonatate  TID and advised to resume zyrtec and pantoprazole  as well as hydrate and use honey to help with cough suppression. He does not feel that these things are helping. He is otherwise compliant with his Aspirin .  Allergies  Allergen Reactions   Prednisone Other (See Comments)    Uratic behavior     Current Outpatient Medications  Medication Sig Dispense Refill   acetaminophen  (TYLENOL ) 500 MG tablet Take 1-2 tablets (500-1,000 mg  total) by mouth every 6 (six) hours as needed for mild pain (pain score 1-3) or fever (or temp >/= 101 F).     amLODipine  (NORVASC ) 10 MG tablet Take 1 tablet (10 mg total) by mouth daily. 90 tablet 1   ASPIRIN  81 PO Take 1 tablet by mouth daily.     benzonatate  (TESSALON ) 200 MG capsule Take 1 capsule (200 mg total) by mouth 3 (three) times daily as needed for cough. 20 capsule 0   cholestyramine  (QUESTRAN ) 4 GM/DOSE powder Take 1 packet (4 g total) by mouth daily. Mix with 4-6 oz liquid. Take other meds 1 hr before or 4-6 hr after cholestyramine . 270 g 5   Continuous Glucose Sensor (FREESTYLE LIBRE 2 SENSOR) MISC by Does not apply route.     cyanocobalamin (VITAMIN B12) 500 MCG tablet Take 500 mcg by mouth daily.     empagliflozin  (JARDIANCE ) 25 MG TABS tablet Take 1 tablet (25 mg total) by mouth daily before breakfast. 90 tablet 1   Ferrous Sulfate (IRON PO) Take 1 tablet by mouth daily.     Insulin  Glargine (BASAGLAR  KWIKPEN [place]) Inject 30 Units into the skin daily with breakfast.     losartan  (COZAAR ) 25 MG tablet Take 50 mg by mouth daily. Take 2 tablets to make 50 mg daily     metFORMIN  (GLUCOPHAGE -XR) 500 MG 24 hr tablet Take 1 tablet (500 mg total) by mouth daily with breakfast.     Misc Natural Products (BEET ROOT PO) Take 1 capsule  by mouth daily.     montelukast (SINGULAIR) 10 MG tablet Take 1 tablet by mouth at bedtime.     Multiple Vitamins-Minerals (MULTIVITAMIN MEN 50+ PO) Take 1 tablet by mouth daily.     naproxen sodium (ALEVE) 220 MG tablet Take 220 mg by mouth daily.      oxyCODONE -acetaminophen  (PERCOCET/ROXICET) 5-325 MG tablet Take 1 tablet by mouth every 4 (four) hours as needed for moderate pain (pain score 4-6). 30 tablet 0   pantoprazole  (PROTONIX ) 40 MG tablet TAKE 1 TABLET BY MOUTH TWICE DAILY BEFORE a meal 60 tablet 3   vitamin C (ASCORBIC ACID) 250 MG tablet Take 250 mg by mouth daily.     No current facility-administered medications for this visit.     ROS:  See  HPI  Physical Exam:  Vitals:   10/18/23 1418  BP: (!) 167/98  Pulse: 90  Resp: 18  Temp: 98.2 F (36.8 C)    Incision:  right neck incision healing very well. Right chest tube incision sites all intact and well appearing, there is one site mid axillary line that has small scab. Some tenderness. No signs of infection.  Extremities:  right upper extremity remains well perfused and warm with palpable radial pulse Neuro: alert and oriented. Speech coherent Abdomen:  soft, non distended   Assessment/Plan:  This is a 53 y.o. male who is s/p: Right subclavian artery transposition to right common carotid artery on 09/14/23 by Dr. Magda. This was performed due to Aberrant right subclavian artery causing dysphagia lusoria. At the same time he had robotic-assisted right VATS for resection of right subclavian artery and intercostal nerve blocks, levels 3 through 10 on 09/14/2023 with Dr. Kerrin. He has done very well since surgery. His incisions are all healing very well. His swallowing is back to normal, however he still has a chronic cough. The cough is at times very disruptive to him both in terms of eating and just irritating when working etc. Discussed with patient and his wife that this could all still be part of healing process and could potentially improve with time. However could also be secondary to something unrelated to his aberrant right subclavian artery.  - Patient was seen with Dr. Magda who agrees that his chronic cough, while we were hopeful surgery would fix it, is likely unrelated to his dysphagia lusoria as his swallowing issue is now resolved but the coughing is persistent. Dr. Magda and myself suspect this may be more of a esophageal motility issue and or something else such as asthma  - Will make referral to Pulmonology to evaluate for his chronic cough. Also recommend following up with his Gastroenterologist, Dr. Cindie -we will have him follow up in 6 months with carotid  duplex   Curry Damme, [facility] [phone]   Clinic MD:  Magda

## 2023-10-20 ENCOUNTER — Other Ambulatory Visit: Payer: Self-pay | Admitting: *Deleted

## 2023-10-20 DIAGNOSIS — R0989 Other specified symptoms and signs involving the circulatory and respiratory systems: Secondary | ICD-10-CM

## 2023-10-24 ENCOUNTER — Encounter: Payer: Self-pay | Admitting: Physician Assistant

## 2023-10-27 ENCOUNTER — Ambulatory Visit (INDEPENDENT_AMBULATORY_CARE_PROVIDER_SITE_OTHER)

## 2023-10-27 VITALS — BP 110/70 | HR 90 | Temp 97.7°F | Ht 71.0 in | Wt 186.0 lb

## 2023-10-27 DIAGNOSIS — J3089 Other allergic rhinitis: Secondary | ICD-10-CM | POA: Diagnosis not present

## 2023-10-27 DIAGNOSIS — R053 Chronic cough: Secondary | ICD-10-CM | POA: Diagnosis not present

## 2023-10-27 DIAGNOSIS — K219 Gastro-esophageal reflux disease without esophagitis: Secondary | ICD-10-CM

## 2023-10-27 LAB — CBC WITH DIFFERENTIAL/PLATELET
Basophils Absolute: 0.1 K/uL (ref 0.0–0.1)
Basophils Relative: 0.5 % (ref 0.0–3.0)
Eosinophils Absolute: 0.3 K/uL (ref 0.0–0.7)
Eosinophils Relative: 1.9 % (ref 0.0–5.0)
HCT: 37 % — ABNORMAL LOW (ref 39.0–52.0)
Hemoglobin: 12.5 g/dL — ABNORMAL LOW (ref 13.0–17.0)
Lymphocytes Relative: 9 % — ABNORMAL LOW (ref 12.0–46.0)
Lymphs Abs: 1.3 K/uL (ref 0.7–4.0)
MCHC: 33.9 g/dL (ref 30.0–36.0)
MCV: 90.5 fl (ref 78.0–100.0)
Monocytes Absolute: 0.8 K/uL (ref 0.1–1.0)
Monocytes Relative: 5.5 % (ref 3.0–12.0)
Neutro Abs: 11.6 K/uL — ABNORMAL HIGH (ref 1.4–7.7)
Neutrophils Relative %: 83.1 % — ABNORMAL HIGH (ref 43.0–77.0)
Platelets: 194 K/uL (ref 150.0–400.0)
RBC: 4.09 Mil/uL — ABNORMAL LOW (ref 4.22–5.81)
RDW: 13.8 % (ref 11.5–15.5)
WBC: 13.9 K/uL — ABNORMAL HIGH (ref 4.0–10.5)

## 2023-10-27 MED ORDER — GABAPENTIN 300 MG PO CAPS: 300.0000 mg | ORAL_CAPSULE | Freq: Three times a day (TID) | ORAL | 3 refills | Status: DC

## 2023-10-27 MED ORDER — ALBUTEROL SULFATE HFA 108 (90 BASE) MCG/ACT IN AERS: 2.0000 | INHALATION_SPRAY | RESPIRATORY_TRACT | 6 refills | Status: AC | PRN

## 2023-10-27 MED ORDER — DEXTROMETHORPHAN-GUAIFENESIN 10-100 MG/5ML PO LIQD: 10.0000 mL | ORAL | 2 refills | Status: AC | PRN

## 2023-10-27 MED ORDER — FLUTICASONE PROPIONATE HFA 110 MCG/ACT IN AERO: 2.0000 | INHALATION_SPRAY | Freq: Two times a day (BID) | RESPIRATORY_TRACT | 12 refills | Status: DC

## 2023-10-27 MED ORDER — CHLORPHENIRAMINE MALEATE 4 MG PO TABS: 4.0000 mg | ORAL_TABLET | Freq: Every day | ORAL | 0 refills | Status: AC

## 2023-10-27 MED ORDER — FLUTICASONE PROPIONATE 50 MCG/ACT NA SUSP: 2.0000 | Freq: Every day | NASAL | 2 refills | Status: AC

## 2023-10-27 NOTE — Progress Notes (Signed)
 Subjective:   PATIENT ID: Frederick Alexander GENDER: male DOB: [birth year], MRN: [id number]   HPI Discussed the use of AI scribe software for clinical note transcription with the patient, who gave verbal consent to proceed.  History of Present Illness Frederick Alexander is a 53 year old male who presents with a persistent cough.  He has experienced a persistent cough for approximately four years, which has been gradually worsening. The cough is triggered by turning his head, swallowing incorrectly, or exposure to strong scents like perfume, leading to prolonged coughing fits that require him to go outside. It is accompanied by a persistent tickle in his throat, exacerbated by certain foods and environmental triggers. 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  The cough significantly impacts his daily life, causing him to sleep on the couch to avoid disturbing his partner due to frequent nighttime coughing episodes. He has tried various treatments over the years, including Protonix  twice daily for acid reflux, but has not found relief for his cough. He denies a history of asthma or using inhalers. Allergy medications like Singulair, which he takes at night, temporarily reduce the tickle sensation in his throat until he eats.  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 He denies any history of asthma or use of inhalers and has not been on steroids like prednisone. He has seasonal allergies for which he takes Zyrtec, but this does not alleviate his cough. He has a cat at home and has not undergone formal allergy testing.  He has a history of diabetes, managed with metformin  and insulin  glargine, and recently started Jardiance . He also takes Protonix  twice daily for acid reflux. He reports a recent onset of heartburn and  acid reflux symptoms over the past two months, which he manages with Protonix . He notes frequent nasal congestion and clear, white expectorations during coughing episodes.     Past Medical History:  Diagnosis Date   Diabetes mellitus (HCC)    type 2   Hyperlipidemia    Hypertension    PONV (postoperative nausea and vomiting)      Family History  Problem Relation Age of Onset   Diabetes Maternal Grandmother    Colon cancer Neg Hx    Gastric cancer Neg Hx    Esophageal cancer Neg Hx    Inflammatory bowel disease Neg Hx    Liver disease Neg Hx    Autoimmune disease Neg Hx      Social History   Socioeconomic History   Marital status: Married    Spouse name: Not on file   Number of children: Not on file   Years of education: Not on file   Highest education level: Not on file  Occupational History   Not on file  Tobacco Use   Smoking status: Never    Passive exposure: Past   Smokeless tobacco: Never  Vaping Use   Vaping status: Never Used  Substance and Sexual Activity   Alcohol use: Never   Drug use: Never   Sexual activity: Yes  Other Topics Concern   Not on file  Social History Narrative   Not on file   Social Drivers of Health   Financial Resource Strain: Not on file  Food Insecurity: No Food Insecurity (09/14/2023)   Hunger Vital Sign    Worried About Running Out  of Food in the Last Year: Never true    Ran Out of Food in the Last Year: Never true  Transportation Needs: No Transportation Needs (09/14/2023)   PRAPARE - Administrator, Civil Service (Medical): No    Lack of Transportation (Non-Medical): No  Physical Activity: Not on file  Stress: Not on file  Social Connections: Not on file  Intimate Partner Violence: Not At Risk (09/14/2023)   Humiliation, Afraid, Rape, and Kick questionnaire    Fear of Current or Ex-Partner: No    Emotionally Abused: No    Physically Abused: No    Sexually Abused: No     Allergies  Allergen Reactions    Prednisone Other (See Comments)    Uratic behavior      Outpatient Medications Prior to Visit  Medication Sig Dispense Refill   acetaminophen  (TYLENOL ) 500 MG tablet Take 1-2 tablets (500-1,000 mg total) by mouth every 6 (six) hours as needed for mild pain (pain score 1-3) or fever (or temp >/= 101 F).     ASPIRIN  81 PO Take 1 tablet by mouth daily.     cholestyramine  (QUESTRAN ) 4 GM/DOSE powder Take 1 packet (4 g total) by mouth daily. Mix with 4-6 oz liquid. Take other meds 1 hr before or 4-6 hr after cholestyramine . 270 g 5   Continuous Glucose Sensor (FREESTYLE LIBRE 2 SENSOR) MISC by Does not apply route.     cyanocobalamin (VITAMIN B12) 500 MCG tablet Take 500 mcg by mouth daily.     empagliflozin  (JARDIANCE ) 25 MG TABS tablet Take 1 tablet (25 mg total) by mouth daily before breakfast. 90 tablet 1   Ferrous Sulfate (IRON PO) Take 1 tablet by mouth daily.     Insulin  Glargine (BASAGLAR  KWIKPEN Branch) Inject 30 Units into the skin daily with breakfast.     losartan  (COZAAR ) 25 MG tablet Take 25 mg by mouth daily.     metFORMIN  (GLUCOPHAGE -XR) 500 MG 24 hr tablet Take 1 tablet (500 mg total) by mouth daily with breakfast.     Misc Natural Products (BEET ROOT PO) Take 1 capsule by mouth daily.     montelukast (SINGULAIR) 10 MG tablet Take 1 tablet by mouth at bedtime.     Multiple Vitamins-Minerals (MULTIVITAMIN MEN 50+ PO) Take 1 tablet by mouth daily.     pantoprazole  (PROTONIX ) 40 MG tablet TAKE 1 TABLET BY MOUTH TWICE DAILY BEFORE a meal 60 tablet 3   vitamin C (ASCORBIC ACID) 250 MG tablet Take 250 mg by mouth daily.     naproxen sodium (ALEVE) 220 MG tablet Take 220 mg by mouth daily.      amLODipine  (NORVASC ) 10 MG tablet Take 1 tablet (10 mg total) by mouth daily. (Patient not taking: Reported on 10/18/2023) 90 tablet 1   benzonatate  (TESSALON ) 200 MG capsule Take 1 capsule (200 mg total) by mouth 3 (three) times daily as needed for cough. (Patient not taking: Reported on 10/18/2023)  20 capsule 0   losartan  (COZAAR ) 25 MG tablet Take 50 mg by mouth daily. Take 2 tablets to make 50 mg daily (Patient not taking: Reported on 10/18/2023)     oxyCODONE -acetaminophen  (PERCOCET/ROXICET) 5-325 MG tablet Take 1 tablet by mouth every 4 (four) hours as needed for moderate pain (pain score 4-6). (Patient not taking: Reported on 10/18/2023) 30 tablet 0   No facility-administered medications prior to visit.    ROS Reviewed all systems and reported negative except as above     Objective:  Vitals:   10/27/23 1325  BP: 110/70  Pulse: 90  Temp: 97.7 F (36.5 C)  TempSrc: Oral  SpO2: 98%  Weight: 186 lb (84.4 kg)  Height: 5' 11 (1.803 m)    Physical Exam Physical Exam GENERAL: Appropriate to age, no acute distress. HEAD EYES EARS NOSE THROAT: Moist mucous membranes, atraumatic, normocephalic. CHEST: Clear to auscultation bilaterally, no wheezing, no crackles, no rales. CARDIAC: Regular rate and rhythm, normal S1, normal S2, no murmurs, no rubs, no gallops. ABDOMEN: Soft, nontender. NEUROLOGICAL: Motor and sensation grossly intact, alert and oriented times X 3. EXTREMITIES: Warm, well perfused, no edema.     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 Esophageal dysmotility noted. GERD suspected to contribute. Asthma considered but unlikely. Discussed  potential esophageal motility issues and need to break throat sensitivity cycle. - Prescribed Flovent and albuterol inhalers for asthma trial. - Ordered pulmonary function tests. - Continued Protonix  twice daily. - Implemented strict GERD diet. - Prescribed gabapentin 300 mg three times daily. - Prescribed Robitussin DM after one week. - Consider repeat esophagram.  Congenital subclavian artery abnormality, post-surgical Post-surgical status with improved swallowing but persistent chronic cough.  Type 2 diabetes mellitus Managed with metformin , Jardiance , and insulin  glargine. Reports frequent urination with elevated blood sugar.  Essential hypertension Recent blood pressure fluctuations post-surgery.  Seasonal allergic rhinitis Managed with Zyrtec, alleviates sneezing but not cough. No recent allergy testing. - Prescribed Flonase nasal spray for daily use. - Prescribed chlorpheniramine 4 mg at night. - Ordered blood test for allergy panel.        Zola Herter, MD [facility] Office: [phone]

## 2023-10-27 NOTE — Patient Instructions (Addendum)
  VISIT SUMMARY: Today, you were seen for a persistent cough that has been troubling you for four years. We discussed your symptoms, including the triggers and the impact on your daily life. We also reviewed your medical history, including your recent surgery, diabetes management, and seasonal allergies.  YOUR PLAN: -CHRONIC COUGH AND THROAT IRRITATION WITH ESOPHAGEAL DYSMOTILITY AND GASTROESOPHAGEAL REFLUX DISEASE: Your chronic cough and throat irritation may be due to issues with the movement of your esophagus and acid reflux. We will try using Flovent and albuterol inhalers to see if they help. You should continue taking Protonix  twice daily and follow a strict diet to manage acid reflux. Additionally, you will start taking gabapentin 300 mg three times daily and Robitussin DM after one week.    -SEASONAL ALLERGIC RHINITIS: You have seasonal allergies that cause sneezing and nasal congestion. We have prescribed Flonase nasal spray for daily use and chlorpheniramine 4 mg at night. We also ordered a blood test to check for specific allergies.  INSTRUCTIONS: Please follow up with the gastrointestinal specialist as referred for further evaluation of your esophagus. Continue taking your medications as prescribed and adhere to the GERD diet. Use the Flovent and albuterol inhalers as directed. Start taking gabapentin 300 mg three times daily and begin Robitussin DM after one week. Use Flonase nasal spray daily and take chlorpheniramine 4 mg at night for your allergies. We will review the results of your allergy panel once available.    Contains text generated by [employer].    Foods to Avoid:  Acidic foods: Citrus fruits, tomatoes, tomato-based products, vinegar, garlic, onions  Fatty foods: Fried foods, fatty meats, whole milk, cheese  Spicy foods: Chili peppers, black pepper, mustard  Chocolate: Contains caffeine and theobromine, which relax the lower esophageal sphincter (LES)  Alcohol: Relaxes the  LES and increases stomach acid production  Caffeine: Found in coffee, tea, and some sodas, it can stimulate stomach acid production    Foods to Eat:  Lean proteins: Chicken, fish, eggs Non-acidic fruits: Apples, bananas, pears, grapes Vegetables: Steamed, roasted, or boiled vegetables (e.g., broccoli, carrots, green beans) Whole grains: Brown rice, quinoa, oatmeal Low-fat dairy: Skim milk, low-fat yogurt, low-fat cheese Water: Staying hydrated helps dilute stomach acid    Other Dietary Recommendations: Eat smaller, more frequent meals. Avoid eating within 2-3 hours of bedtime. Chew food thoroughly. Limit sugary drinks and processed foods. Consider a Mediterranean-style diet, which is rich in fruits, vegetables, and whole grains.

## 2023-10-28 ENCOUNTER — Ambulatory Visit: Payer: Self-pay

## 2023-10-28 DIAGNOSIS — D72825 Bandemia: Secondary | ICD-10-CM

## 2023-10-28 DIAGNOSIS — R053 Chronic cough: Secondary | ICD-10-CM

## 2023-10-31 LAB — RESPIRATORY ALLERGY PANEL REGION II W/ RFLX: ~~LOC~~
Allergen, A. alternata, m6: <0.1 kU/L
Allergen, Cedar tree, t12: <0.1 kU/L
Allergen, Comm Silver Birch, t9: 0.17 kU/L — ABNORMAL HIGH
Allergen, Cottonwood, t14: 0.19 kU/L — ABNORMAL HIGH
Allergen, D pternoyssinus,d7: <0.1 kU/L
Allergen, Mouse Urine Protein, e78: <0.1 kU/L
Allergen, Mulberry, t76: <0.1 kU/L
Allergen, Oak,t7: 0.13 kU/L — ABNORMAL HIGH
Allergen, P. notatum, m1: <0.1 kU/L
Aspergillus fumigatus, m3: <0.1 kU/L
Bermuda Grass: 2.99 kU/L — ABNORMAL HIGH
Box Elder IgE: 0.38 kU/L — ABNORMAL HIGH
CLADOSPORIUM HERBARUM (M2) IGE: <0.1 kU/L
COMMON RAGWEED (SHORT) (W1) IGE: 0.16 kU/L — ABNORMAL HIGH
Cat Dander: 15.5 kU/L — ABNORMAL HIGH
Class: 0
Class: 0
Class: 0
Class: 0
Class: 0
Class: 0
Class: 0
Class: 0
Class: 0
Class: 0
Class: 1
Class: 2
Class: 2
Class: 2
Class: 3
Class: 3
Cockroach: 0.33 kU/L — ABNORMAL HIGH
D. farinae: <0.1 kU/L
Dog Dander: 2.04 kU/L — ABNORMAL HIGH
Elm IgE: 0.14 kU/L — ABNORMAL HIGH
IgE (Immunoglobulin E), Serum: 323 kU/L — ABNORMAL HIGH (ref <=114)
Johnson Grass: 1.67 kU/L — ABNORMAL HIGH
Pecan/Hickory Tree IgE: 0.32 kU/L — ABNORMAL HIGH
Rough Pigweed  IgE: <0.1 kU/L
Sheep Sorrel IgE: 0.1 kU/L — ABNORMAL HIGH
Timothy Grass: 5.94 kU/L — ABNORMAL HIGH

## 2023-10-31 LAB — INTERPRETATION:

## 2023-10-31 LAB — DOG DANDER COMPONENT
Can f 4(e229) IgE: <0.1 kU/L (ref <0.10)
Can f 6(e230) IgE: 0.32 kU/L — ABNORMAL HIGH (ref <0.10)
E101-IgE Can f 1: 1.61 kU/L — ABNORMAL HIGH (ref <0.10)
E102-IgE Can f 2: <0.1 kU/L (ref <0.10)
E221-IgE Can f 3: 0.21 kU/L — ABNORMAL HIGH (ref <0.10)
E226-IgE Can f 5: <0.1 kU/L (ref <0.10)

## 2023-10-31 LAB — CAT DANDER COMPONENT
E220-IgE Fel d 2: 0.21 kU/L — ABNORMAL HIGH (ref <0.10)
E228-IgE Fel d 4: 4.92 kU/L — ABNORMAL HIGH (ref <0.10)
Fel d 1 (e94) IgE: 2.92 kU/L — ABNORMAL HIGH (ref <0.10)
Fel d 7 (e231) IgE: 8.98 kU/L — ABNORMAL HIGH (ref <0.10)

## 2023-11-01 ENCOUNTER — Telehealth: Payer: Self-pay

## 2023-11-01 NOTE — Telephone Encounter (Signed)
*  Pulm  Pharmacy Patient Advocate Encounter   Received notification from Fax that prior authorization for Fluticasone Propionate HFA 110MCG/ACT aerosol  is required/requested.   Insurance verification completed.   The patient is insured through [facility].   Per test claim: PA required; PA submitted to above mentioned insurance via Latent Key/confirmation #/EOC [id number] Status is pending

## 2023-11-01 NOTE — Progress Notes (Signed)
 Called and spoke to pt - advised of lab results per Dr. Zaida. Pt verbalized understanding and states that he can come in Friday 10/31 for blood work and will call the day before for an appt. NFN

## 2023-11-08 NOTE — Telephone Encounter (Signed)
 Denied due to patient not trying 2 preferred alternatives of: Arnuity Ellipta, Asmanex Twisthaler/HFA or Qvar Redihaler

## 2023-11-09 ENCOUNTER — Other Ambulatory Visit: Payer: Self-pay

## 2023-11-09 DIAGNOSIS — R053 Chronic cough: Secondary | ICD-10-CM

## 2023-11-09 MED ORDER — ARNUITY ELLIPTA 100 MCG/ACT IN AEPB: 1.0000 | INHALATION_SPRAY | Freq: Every day | RESPIRATORY_TRACT | 4 refills | Status: AC

## 2023-11-09 NOTE — Telephone Encounter (Signed)
 Please see message from pharmacy and advise on what inhaler you would like patient to try.  Thank you.

## 2023-11-10 NOTE — Telephone Encounter (Signed)
 ATC X1. I left a vm informing pt per DPR. nfn

## 2023-11-16 ENCOUNTER — Encounter: Payer: Self-pay | Admitting: "Endocrinology

## 2023-11-16 ENCOUNTER — Ambulatory Visit (INDEPENDENT_AMBULATORY_CARE_PROVIDER_SITE_OTHER): Admitting: "Endocrinology

## 2023-11-16 VITALS — BP 160/86 | HR 100 | Ht 71.0 in | Wt 182.6 lb

## 2023-11-16 DIAGNOSIS — E1165 Type 2 diabetes mellitus with hyperglycemia: Secondary | ICD-10-CM | POA: Diagnosis not present

## 2023-11-16 DIAGNOSIS — I10 Essential (primary) hypertension: Secondary | ICD-10-CM | POA: Diagnosis not present

## 2023-11-16 DIAGNOSIS — Z794 Long term (current) use of insulin: Secondary | ICD-10-CM | POA: Diagnosis not present

## 2023-11-16 DIAGNOSIS — E781 Pure hyperglyceridemia: Secondary | ICD-10-CM

## 2023-11-16 DIAGNOSIS — E1140 Type 2 diabetes mellitus with diabetic neuropathy, unspecified: Secondary | ICD-10-CM | POA: Diagnosis not present

## 2023-11-16 DIAGNOSIS — R748 Abnormal levels of other serum enzymes: Secondary | ICD-10-CM

## 2023-11-16 DIAGNOSIS — Z7984 Long term (current) use of oral hypoglycemic drugs: Secondary | ICD-10-CM

## 2023-11-16 NOTE — Patient Instructions (Signed)

## 2023-11-16 NOTE — Progress Notes (Signed)
 11/16/2023, 10:46 AM  Endocrinology follow-up note   Subjective:    Patient ID: Frederick Alexander, male    DOB: 03-Sep-1970.  AVERILL Alexander is being seen in follow-up after he was seen in consultation for management of currently uncontrolled symptomatic diabetes requested by  Skillman, Katherine E, PA-C.   Past Medical History:  Diagnosis Date   Diabetes mellitus (HCC)    type 2   Hyperlipidemia    Hypertension    PONV (postoperative nausea and vomiting)     Past Surgical History:  Procedure Laterality Date   BIOPSY  10/16/2021   Procedure: BIOPSY;  Surgeon: Cindie Carlin POUR, DO;  Location: [facility];  Service: Endoscopy;;   CAROTID-SUBCLAVIAN BYPASS GRAFT Right 09/14/2023   Procedure: RIGHT CAROTID SUBCLAVIAN TRANSPOSITION;  Surgeon: Magda Debby SAILOR, MD;  Location: [facility] OR;  Service: Vascular;  Laterality: Right;   CHOLECYSTECTOMY  10/04/2016   Dr. Ivery   COLONOSCOPY N/A 04/11/2023   Procedure: COLONOSCOPY;  Surgeon: Cindie Carlin POUR, DO;  Location: [facility];  Service: Endoscopy;  Laterality: N/A;  730AM, ASA 3   ESOPHAGOGASTRODUODENOSCOPY (EGD) WITH PROPOFOL  N/A 10/16/2021   Procedure: ESOPHAGOGASTRODUODENOSCOPY (EGD) WITH PROPOFOL ;  Surgeon: Cindie Carlin POUR, DO;  Location: [facility];  Service: Endoscopy;  Laterality: N/A;  9:00 am   POLYPECTOMY  04/11/2023   Procedure: POLYPECTOMY, INTESTINE;  Surgeon: Cindie Carlin POUR, DO;  Location: [facility];  Service: Endoscopy;;   RIGHT/LEFT HEART CATH AND CORONARY ANGIOGRAPHY N/A 03/25/2017   Procedure: RIGHT/LEFT HEART CATH AND CORONARY ANGIOGRAPHY;  Surgeon: Dann Candyce RAMAN, MD;  Location: [facility] INVASIVE CV LAB;  Service: Cardiovascular;  Laterality: N/A;   THORACOSCOPY, ROBOT-ASSISTED Right 09/14/2023   Procedure: ROBOT-ASSISTED THORACOSCOPY, TO LIGATE THE ABBERRANT RIGHT SUBCLAVIAN ARTERY;  Surgeon: Kerrin Elspeth BROCKS, MD;  Location:  [facility] OR;  Service: Thoracic;  Laterality: Right;  Right carotid subclavian bypass and a 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    Social History   Socioeconomic History   Marital status: Married    Spouse name: Not on file   Number of children: Not on file   Years of education: Not on file   Highest education level: Not on file  Occupational History   Not on file  Tobacco Use   Smoking status: Never    Passive exposure: Past   Smokeless tobacco: Never  Vaping Use   Vaping status: Never Used  Substance and Sexual Activity   Alcohol use: Never   Drug use: Never   Sexual activity: Yes  Other Topics Concern   Not on file  Social History Narrative   Not on file   Social Drivers of Health   Financial Resource Strain: Not on file  Food Insecurity: No Food Insecurity (09/14/2023)   Hunger Vital Sign    Worried About Running Out of Food in the Last Year: Never true    Ran Out of Food in the Last Year: Never true  Transportation Needs: No Transportation Needs (09/14/2023)   PRAPARE - Transportation    Lack of Transportation (Medical): No    Lack  of Transportation (Non-Medical): No  Physical Activity: Not on file  Stress: Not on file  Social Connections: Not on file    Family History  Problem Relation Age of Onset   Diabetes Maternal Grandmother    Colon cancer Neg Hx    Gastric cancer Neg Hx    Esophageal cancer Neg Hx    Inflammatory bowel disease Neg Hx    Liver disease Neg Hx    Autoimmune disease Neg Hx     Outpatient Encounter Medications as of 11/16/2023  Medication Sig   acetaminophen  (TYLENOL ) 500 MG tablet Take 1-2 tablets (500-1,000 mg total) by mouth every 6 (six) hours as needed for mild pain (pain score 1-3) or fever (or temp >/= 101 F).   albuterol (VENTOLIN HFA) 108 (90 Base) MCG/ACT inhaler Inhale 2 puffs into the lungs every 4 (four) hours as needed for wheezing or shortness of breath.   ASPIRIN  81 PO Take 1 tablet by  mouth daily.   chlorpheniramine (CHLORPHEN) 4 MG tablet Take 1 tablet (4 mg total) by mouth at bedtime.   cholestyramine  (QUESTRAN ) 4 GM/DOSE powder Take 1 packet (4 g total) by mouth daily. Mix with 4-6 oz liquid. Take other meds 1 hr before or 4-6 hr after cholestyramine .   Continuous Glucose Sensor (FREESTYLE LIBRE 2 SENSOR) MISC by Does not apply route.   cyanocobalamin (VITAMIN B12) 500 MCG tablet Take 500 mcg by mouth daily.   dextromethorphan-guaiFENesin (ROBITUSSIN-DM) 10-100 MG/5ML liquid Take 10 mLs by mouth every 4 (four) hours as needed for cough.   empagliflozin  (JARDIANCE ) 25 MG TABS tablet Take 1 tablet (25 mg total) by mouth daily before breakfast.   Ferrous Sulfate (IRON PO) Take 1 tablet by mouth daily.   fluticasone (FLONASE) 50 MCG/ACT nasal spray Place 2 sprays into both nostrils daily.   Fluticasone Furoate (ARNUITY ELLIPTA) 100 MCG/ACT AEPB Inhale 1 puff into the lungs daily.   gabapentin (NEURONTIN) 300 MG capsule Take 1 capsule (300 mg total) by mouth 3 (three) times daily.   Insulin  Glargine (BASAGLAR  KWIKPEN [place]) Inject 40 Units into the skin daily with breakfast.   losartan  (COZAAR ) 25 MG tablet Take 25 mg by mouth daily.   metFORMIN  (GLUCOPHAGE -XR) 500 MG 24 hr tablet Take 1 tablet (500 mg total) by mouth daily with breakfast.   Misc Natural Products (BEET ROOT PO) Take 1 capsule by mouth daily.   montelukast (SINGULAIR) 10 MG tablet Take 1 tablet by mouth at bedtime.   Multiple Vitamins-Minerals (MULTIVITAMIN MEN 50+ PO) Take 1 tablet by mouth daily.   pantoprazole  (PROTONIX ) 40 MG tablet TAKE 1 TABLET BY MOUTH TWICE DAILY BEFORE a meal   vitamin C (ASCORBIC ACID) 250 MG tablet Take 250 mg by mouth daily.   No facility-administered encounter medications on file as of 11/16/2023.    ALLERGIES: Allergies  Allergen Reactions   Prednisone Other (See Comments)    Uratic behavior     VACCINATION STATUS:  There is no immunization history on file for this  patient.  Diabetes He presents for his follow-up diabetic visit. He has type 2 diabetes mellitus. Onset time: He was diagnosed approximate age of 30 years, at which time he weighed approximately 300 pounds. His disease course has been fluctuating. There are no hypoglycemic associated symptoms. Pertinent negatives for hypoglycemia include no confusion, headaches, pallor or seizures. Pertinent negatives for diabetes include no blurred vision, no chest pain, no fatigue, no polydipsia, no polyphagia, no polyuria and no weakness. Hypoglycemia complications include hospitalization. (He  reports at least 1 time hospitalization for hypoglycemia.) Symptoms are improving. Diabetic complications include peripheral neuropathy. Risk factors for coronary artery disease include diabetes mellitus, hypertension, family history and male sex. Current diabetic treatments: He is currently on Basaglar  40 units every 24 hours, metformin  1000 mg p.o. twice daily. His weight is fluctuating minimally. He is following a generally unhealthy diet. When asked about meal planning, he reported none. He has not had a previous visit with a dietitian. He rarely participates in exercise. His home blood glucose trend is fluctuating minimally. His breakfast blood glucose range is generally >200 mg/dl. His lunch blood glucose range is generally >200 mg/dl. His dinner blood glucose range is generally >200 mg/dl. His bedtime blood glucose range is generally >200 mg/dl. His overall blood glucose range is >200 mg/dl. (He presents with freestyle libre device showing 31% time in range, 34% level 1 hyperglycemia, 34% level 2 hyperglycemia.  His recent A1c was 9.1% overall improving from 10.1%.  He has no hypoglycemia.  He is a recent 14 days average blood glucose is 222 mg per DL with 64.8% glucose variability. ) An ACE inhibitor/angiotensin II receptor blocker is not being taken.  Hypertension This is a chronic problem. The current episode started more  than 1 year ago. The problem is uncontrolled. Pertinent negatives include no blurred vision, chest pain, headaches, neck pain, palpitations or shortness of breath. Risk factors for coronary artery disease include diabetes mellitus, dyslipidemia and sedentary lifestyle. Past treatments include angiotensin blockers. Identifiable causes of hypertension include chronic renal disease.     Objective:       11/16/2023    8:35 AM 10/27/2023    1:25 PM 10/18/2023    2:18 PM  Vitals with BMI  Height 5' 11 5' 11 5' 11  Weight 182 lbs 10 oz 186 lbs 180 lbs 5 oz  BMI 25.48 25.95 25.16  Systolic 160 110 832  Diastolic 86 70 98  Pulse 100 90 90    BP (!) 160/86   Pulse 100   Ht 5' 11 (1.803 m)   Wt 182 lb 9.6 oz (82.8 kg)   BMI 25.47 kg/m   Wt Readings from Last 3 Encounters:  11/16/23 182 lb 9.6 oz (82.8 kg)  10/27/23 186 lb (84.4 kg)  10/18/23 180 lb 4.8 oz (81.8 kg)       CMP ( most recent) CMP     Component Value Date/Time   NA 139 09/16/2023 0410   K 4.1 09/16/2023 0410   CL 103 09/16/2023 0410   CO2 26 09/16/2023 0410   GLUCOSE 185 (H) 09/16/2023 0410   BUN 32 (H) 09/16/2023 0410   BUN 36 (A) 07/05/2023 0000   CREATININE 1.12 09/16/2023 0410   CALCIUM  8.1 (L) 09/16/2023 0410   PROT 7.0 09/12/2023 1229   ALBUMIN 4.0 09/12/2023 1229   AST 46 (H) 09/12/2023 1229   ALT 62 (H) 09/12/2023 1229   ALKPHOS 130 (H) 09/12/2023 1229   BILITOT 0.8 09/12/2023 1229   EGFR 49 07/05/2023 0000   GFRNONAA >60 09/16/2023 0410     Diabetic Labs (most recent): Lab Results  Component Value Date   HGBA1C 9.1 (H) 09/14/2023   HGBA1C 10.1 07/05/2023   HGBA1C 10.6 (A) 09/20/2018     Lipid Panel ( most recent) Lipid Panel     Component Value Date/Time   CHOL 95 07/05/2023 0000   TRIG 175 (A) 07/05/2023 0000   HDL 58 07/05/2023 0000   LDLCALC 15 07/05/2023 0000  Assessment & Plan:   1. Poorly controlled type 2 diabetes mellitus with neuropathy (HCC) (Primary)  -  Frederick Alexander has currently uncontrolled symptomatic type 2 DM since  53 years of age.  He presents with freestyle libre device showing 31% time in range, 34% level 1 hyperglycemia, 34% level 2 hyperglycemia.  His recent A1c was 9.1% overall improving from 10.1%.  He has no hypoglycemia.  He is a recent 14 days average blood glucose is 222 mg per DL with 64.8% glucose variability.   -Recent labs reviewed. - I had a long discussion with him about the possible risk factors and  the pathology behind diabetes and its complications. -his diabetes is complicated by neuropathy, comorbid hypertension, hypertriglyceridemia and he remains at exceedingly high risk for more acute and chronic complications which include CAD, CVA, CKD, retinopathy, and neuropathy. These are all discussed in detail with him.  - I discussed all available options of managing his diabetes including de-escalation of medications.  Patient is encouraged to switch to  unprocessed or minimally processed  complex starch, adequate protein intake (mainly plant source), minimal liquid fat, plenty of fruits, and vegetables. -  he is advised to stick to a routine mealtimes to eat 3 complete meals a day and snack only when necessary (to snack only to correct hypoglycemia BG <70 day time or <100 at night).    - he acknowledges that there is a room for improvement in his food and drink choices. - Further Specific Suggestion is made for him to avoid simple carbohydrates  from his diet including Cakes, Sweet Desserts, Ice Cream, Soda (diet and regular), Sweet Tea, Candies, Chips, Cookies, Store Bought Juices, Alcohol ,  Artificial Sweeteners,  Coffee Creamer, and Sugar-free Products. This will help patient to have more stable blood glucose profile and potentially avoid unintended weight gain.  - he will be scheduled with Penny Crumpton, RDN, CDE for individualized diabetes education.  - I have approached him with the following individualized plan  to manage  his diabetes and patient agrees:    - He worries about hypoglycemia, insulin  dose adjustment would be undertaken slowly.  He is approached to increase his Basaglar  to 40 units daily with breakfast, associated with continuous monitoring of blood glucose using his CGM device.   He does not want to take basal insulin  at night. - he is encouraged to call clinic for blood glucose levels less than 70 or above 200 mg per DL weekly average.   - He he is benefiting from the SGLT2 inhibitor prescribed for him during his last visit.  He is advised to continue Jardiance  25 mg p.o. daily at breakfast, metformin  500 mg p.o. daily at breakfast.   - Specific targets for  A1c;  LDL, HDL,  and Triglycerides were discussed with the patient.  2) Blood Pressure /Hypertension:  His blood pressure is not controlled to target.  He is advised to increase losartan  to 50 mg p.o. daily along with his amlodipine  10 mg p.o. daily at breakfast.  Patient reports that he did not take his blood pressure medications this morning, however was found to have blood pressure 156/86 after 15 minutes of resting in the clinic.  3) Lipids/Hyperlipidemia:   Review of his recent lipid panel showed uncontrolled triglycerides at 175, however low LDL of 15 , total cholesterol 95, HDL 58.  He was never treated with Repatha.  Not clear if he has syndromic dyslipidemia at this time, however he is advised to stay on  his cholestyramine .  He is not on statins for now.  Patient has MASLD, status post liver biopsy.  He will have repeat fasting lipid panel on subsequent visits.  He will likely benefit from whole food plant-based diet which is briefly discussed with him.   4)  Weight/Diet:  Body mass index is 25.47 kg/m.  -    he is not a candidate for major weight loss. I discussed with him the fact that loss of 5 - 10% of his  current body weight will have the most impact on his diabetes management.  The above detailed  ACLM recommendations  for nutrition, exercise, sleep, social life, avoidance of risky substances, the need for restorative sleep  information is also detailed on discharge instructions.  5) Chronic Care/Health Maintenance:  -he  is on ARB  medications and  is encouraged to initiate and continue to follow up with Ophthalmology, Dentist,  Podiatrist at least yearly or according to recommendations, and advised to   stay away from smoking. I have recommended yearly flu vaccine and pneumonia vaccine at least every 5 years; moderate intensity exercise for up to 150 minutes weekly; and  sleep for 7- 9 hours a day.  - he is  advised to maintain close follow up with Skillman, Katherine E, PA-C for primary care needs, as well as his other providers for optimal and coordinated care.  I spent  26  minutes in the care of the patient today including review of labs from CMP, Lipids, Thyroid Function, Hematology (current and previous including abstractions from other facilities); face-to-face time discussing  his blood glucose readings/logs, discussing hypoglycemia and hyperglycemia episodes and symptoms, medications doses, his options of short and long term treatment based on the latest standards of care / guidelines;  discussion about incorporating lifestyle medicine;  and documenting the encounter. Risk reduction counseling performed per USPSTF guidelines to reduce cardiovascular risk factors.     Please refer to Patient Instructions for Blood Glucose Monitoring and Insulin /Medications Dosing Guide  in media tab for additional information. Please  also refer to  Patient Self Inventory in the Media  tab for reviewed elements of pertinent patient history.  Frederick Alexander participated in the discussions, expressed understanding, and voiced agreement with the above plans.  All questions were answered to his satisfaction. he is encouraged to contact clinic should he have any questions or concerns prior to his return visit.   Follow up  plan: - Return in about 3 months (around 02/16/2024) for F/U with Pre-visit Labs, Meter/CGM/Logs, A1c here.  Frederick Earl, MD [facility] [address] [place], [place] [address] Phone: [phone]  Fax: [phone]    11/16/2023, 10:46 AM  This note was partially dictated with voice recognition software. Similar sounding words can be transcribed inadequately or may not  be corrected upon review.

## 2023-11-17 ENCOUNTER — Other Ambulatory Visit (INDEPENDENT_AMBULATORY_CARE_PROVIDER_SITE_OTHER)

## 2023-11-17 DIAGNOSIS — D72825 Bandemia: Secondary | ICD-10-CM | POA: Diagnosis not present

## 2023-11-17 DIAGNOSIS — R053 Chronic cough: Secondary | ICD-10-CM | POA: Diagnosis not present

## 2023-11-18 LAB — CBC WITH DIFFERENTIAL/PLATELET
Basophils Absolute: 0.2 K/uL — ABNORMAL HIGH (ref 0.0–0.1)
Basophils Relative: 2.1 % (ref 0.0–3.0)
Eosinophils Absolute: 0.3 K/uL (ref 0.0–0.7)
Eosinophils Relative: 4.1 % (ref 0.0–5.0)
HCT: 36.3 % — ABNORMAL LOW (ref 39.0–52.0)
Hemoglobin: 12.5 g/dL — ABNORMAL LOW (ref 13.0–17.0)
Lymphocytes Relative: 25 % (ref 12.0–46.0)
Lymphs Abs: 2 K/uL (ref 0.7–4.0)
MCHC: 34.5 g/dL (ref 30.0–36.0)
MCV: 89.6 fl (ref 78.0–100.0)
Monocytes Absolute: 0.7 K/uL (ref 0.1–1.0)
Monocytes Relative: 8.2 % (ref 3.0–12.0)
Neutro Abs: 4.9 K/uL (ref 1.4–7.7)
Neutrophils Relative %: 60.6 % (ref 43.0–77.0)
Platelets: 217 K/uL (ref 150.0–400.0)
RBC: 4.05 Mil/uL — ABNORMAL LOW (ref 4.22–5.81)
RDW: 13.5 % (ref 11.5–15.5)
WBC: 8.1 K/uL (ref 4.0–10.5)

## 2023-11-28 ENCOUNTER — Telehealth: Payer: Self-pay

## 2023-11-28 NOTE — Telephone Encounter (Signed)
 Copied from CRM #[id number]. Topic: Clinical - Lab/Test Results >> Nov 28, 2023 11:54 AM Lavanda D wrote: Reason for CRM: Patient's wife is calling to follow-up on his recent lab recheck from Dr. Zaida. Please call once results have been reviewed.  Dr. Zaida can you please advise of lab results.

## 2023-12-08 ENCOUNTER — Telehealth: Payer: Self-pay | Admitting: *Deleted

## 2023-12-08 NOTE — Telephone Encounter (Signed)
 I spoke with Dr. Zaida regarding labs  He has tried calling the pt several times and also left voice mail  He called again today- all three numbers and no answer   He states that if they call again please tell them that he has a bunch of allergies that he can see on mychart and he should be taking an allergy  pill like zyrtec. His most prominent allergy  is cats and if I recall correctly he has cats at home  [facility] looks like from my note, its one cat

## 2023-12-08 NOTE — Telephone Encounter (Signed)
 Copied from CRM #[id number]. Topic: Clinical - Lab/Test Results >> Dec 05, 2023  1:43 PM Devaughn RAMAN wrote: Reason for CRM: Pt wife Dorthea called to f/u regarding pt lab results, stated pt has been waiting since before Thanksgiving for results and would like a f/u call.   Duplicate- msg already sent to Dr. Zaida about labs

## 2023-12-09 NOTE — Telephone Encounter (Signed)
 Called patient.  Informed patient regarding lab results.  Patient will start taking Zyrtec on a daily basis and try to avoid having the cat lay on his stomach while he is working on his cars.  Patient wants to let Dr. Donzetta know the surgery to ligate the subclavian artery has helped him be able to swallow his fool much better but he still has the tickle in his throat and he cannot eat Mexican salsa or black pepper on food without severe irritation in back of throat.  Dr. Donzetta, please advise what patient can do for this tickle in his throat and what the surgery results showed.

## 2023-12-12 NOTE — Telephone Encounter (Signed)
 Called patient.  Reviewed information per Dr. Donzetta.  Patient verbalized understanding.

## 2023-12-26 ENCOUNTER — Ambulatory Visit

## 2023-12-26 ENCOUNTER — Ambulatory Visit (INDEPENDENT_AMBULATORY_CARE_PROVIDER_SITE_OTHER)

## 2023-12-26 VITALS — BP 140/74 | HR 100 | Ht 71.0 in | Wt 187.6 lb

## 2023-12-26 DIAGNOSIS — J302 Other seasonal allergic rhinitis: Secondary | ICD-10-CM

## 2023-12-26 DIAGNOSIS — R053 Chronic cough: Secondary | ICD-10-CM | POA: Diagnosis not present

## 2023-12-26 DIAGNOSIS — K219 Gastro-esophageal reflux disease without esophagitis: Secondary | ICD-10-CM

## 2023-12-26 DIAGNOSIS — J3089 Other allergic rhinitis: Secondary | ICD-10-CM

## 2023-12-26 LAB — PULMONARY FUNCTION TEST
DL/VA % pred: 110 %
DL/VA: 4.82 ml/min/mmHg/L
DLCO cor % pred: 98 %
DLCO cor: 29.43 ml/min/mmHg
DLCO unc % pred: 92 %
DLCO unc: 27.53 ml/min/mmHg
FEF 25-75 Post: 4.07 L/s
FEF 25-75 Pre: 3.22 L/s
FEF2575-%Change-Post: 26 %
FEF2575-%Pred-Post: 119 %
FEF2575-%Pred-Pre: 94 %
FEV1-%Change-Post: 4 %
FEV1-%Pred-Post: 89 %
FEV1-%Pred-Pre: 85 %
FEV1-Post: 3.54 L
FEV1-Pre: 3.39 L
FEV1FVC-%Change-Post: 2 %
FEV1FVC-%Pred-Pre: 104 %
FEV6-%Change-Post: 1 %
FEV6-%Pred-Post: 86 %
FEV6-%Pred-Pre: 84 %
FEV6-Post: 4.28 L
FEV6-Pre: 4.2 L
FEV6FVC-%Pred-Post: 103 %
FEV6FVC-%Pred-Pre: 103 %
FVC-%Change-Post: 1 %
FVC-%Pred-Post: 83 %
FVC-%Pred-Pre: 81 %
FVC-Post: 4.28 L
FVC-Pre: 4.21 L
Post FEV1/FVC ratio: 83 %
Post FEV6/FVC ratio: 100 %
Pre FEV1/FVC ratio: 81 %
Pre FEV6/FVC Ratio: 100 %
RV % pred: 126 %
RV: 2.73 L
TLC % pred: 95 %
TLC: 6.88 L

## 2023-12-26 MED ORDER — GABAPENTIN 300 MG PO CAPS: 300.0000 mg | ORAL_CAPSULE | Freq: Every day | ORAL | 3 refills | Status: AC

## 2023-12-26 MED ORDER — CETIRIZINE HCL 10 MG PO TABS: 10.0000 mg | ORAL_TABLET | Freq: Every day | ORAL | 3 refills | Status: AC

## 2023-12-26 NOTE — Progress Notes (Signed)
 "   Subjective:   PATIENT ID: Frederick Alexander: male DOB: [birth year], MRN: [id number]   HPI Discussed the use of AI scribe software for clinical note transcription with the patient, who gave verbal consent to proceed.  History of Present Illness Frederick Alexander is a 53 year old male with esophageal dysmotility and allergies who presents with persistent throat tickle and cough.  He has experienced a persistent tickle in his throat for four years, with worsening symptoms over the past year. The sensation leads to severe coughing fits, sometimes necessitating induced vomiting for relief. The tickle is exacerbated by exposure to allergens such as cat dander, smoke, perfume, and pepper. Despite being away from home, the tickle persists, indicating a chronic underlying issue.  He has a confirmed allergy  to cats, with blood tests showing high levels of cat dander. Despite regular use of Zyrtec , symptoms persist. He lives with an indoor cat and another outdoor cat, which he believes contribute to his symptoms. He has tried various allergy  medications, including Zyrtec , chlorpheniramine , and Singulair, with limited relief. Gabapentin  helps numb his throat at night, improving sleep.  He underwent surgery in the past, which improved swallowing but did not alleviate the throat tickle. Imaging studies, including a CT scan in 2024, showed normal lung function. Pulmonary function tests are also normal, indicating healthy lungs. He has a history of esophageal dysmotility.  His symptoms significantly impact his daily life, causing embarrassment at work and in social settings due to coughing fits and mucus production. The mucus is described as a 'clear white liquid' that sometimes 'shoots across the floor' during coughing episodes. He is concerned about the social implications, especially in public places like restaurants.  No smoking or dog ownership.     Past Medical History:  Diagnosis Date   Diabetes  mellitus (HCC)    type 2   Hyperlipidemia    Hypertension    PONV (postoperative nausea and vomiting)      Family History  Problem Relation Age of Onset   Diabetes Maternal Grandmother    Colon cancer Neg Hx    Gastric cancer Neg Hx    Esophageal cancer Neg Hx    Inflammatory bowel disease Neg Hx    Liver disease Neg Hx    Autoimmune disease Neg Hx      Social History   Socioeconomic History   Marital status: Married    Spouse name: Not on file   Number of children: Not on file   Years of education: Not on file   Highest education level: Not on file  Occupational History   Not on file  Tobacco Use   Smoking status: Never    Passive exposure: Past   Smokeless tobacco: Never  Vaping Use   Vaping status: Never Used  Substance and Sexual Activity   Alcohol use: Never   Drug use: Never   Sexual activity: Yes  Other Topics Concern   Not on file  Social History Narrative   Not on file   Social Drivers of Health   Tobacco Use: Low Risk (12/26/2023)   Patient History    Smoking Tobacco Use: Never    Smokeless Tobacco Use: Never    Passive Exposure: Past  Financial Resource Strain: Not on file  Food Insecurity: No Food Insecurity (09/14/2023)   Epic    Worried About Programme Researcher, Broadcasting/film/video in the Last Year: Never true    Ran Out of Food in the Last Year: Never true  Transportation Needs: No Transportation Needs (09/14/2023)   Epic    Lack of Transportation (Medical): No    Lack of Transportation (Non-Medical): No  Physical Activity: Not on file  Stress: Not on file  Social Connections: Not on file  Intimate Partner Violence: Not At Risk (09/14/2023)   Epic    Fear of Current or Ex-Partner: No    Emotionally Abused: No    Physically Abused: No    Sexually Abused: No  Depression (PHQ2-9): Not on file  Alcohol Screen: Not on file  Housing: Low Risk (09/14/2023)   Epic    Unable to Pay for Housing in the Last Year: No    Number of Times Moved in the Last Year: 0     Homeless in the Last Year: No  Utilities: Not At Risk (09/14/2023)   Epic    Threatened with loss of utilities: No  Health Literacy: Not on file     Allergies[1]   Outpatient Medications Prior to Visit  Medication Sig Dispense Refill   acetaminophen  (TYLENOL ) 500 MG tablet Take 1-2 tablets (500-1,000 mg total) by mouth every 6 (six) hours as needed for mild pain (pain score 1-3) or fever (or temp >/= 101 F).     albuterol  (VENTOLIN  HFA) 108 (90 Base) MCG/ACT inhaler Inhale 2 puffs into the lungs every 4 (four) hours as needed for wheezing or shortness of breath. 8 g 6   ASPIRIN  81 PO Take 1 tablet by mouth daily.     chlorpheniramine  (CHLORPHEN) 4 MG tablet Take 1 tablet (4 mg total) by mouth at bedtime. 14 tablet 0   Continuous Glucose Sensor (FREESTYLE LIBRE 2 SENSOR) MISC by Does not apply route.     cyanocobalamin (VITAMIN B12) 500 MCG tablet Take 500 mcg by mouth daily.     empagliflozin  (JARDIANCE ) 25 MG TABS tablet Take 1 tablet (25 mg total) by mouth daily before breakfast. 90 tablet 1   Ferrous Sulfate (IRON PO) Take 1 tablet by mouth daily.     fluticasone  (FLONASE ) 50 MCG/ACT nasal spray Place 2 sprays into both nostrils daily. 16 g 2   Insulin  Glargine (BASAGLAR  KWIKPEN [place]) Inject 40 Units into the skin daily with breakfast.     losartan  (COZAAR ) 25 MG tablet Take 25 mg by mouth daily.     metFORMIN  (GLUCOPHAGE -XR) 500 MG 24 hr tablet Take 1 tablet (500 mg total) by mouth daily with breakfast.     Misc Natural Products (BEET ROOT PO) Take 1 capsule by mouth daily.     montelukast (SINGULAIR) 10 MG tablet Take 1 tablet by mouth at bedtime.     Multiple Vitamins-Minerals (MULTIVITAMIN MEN 50+ PO) Take 1 tablet by mouth daily.     pantoprazole  (PROTONIX ) 40 MG tablet TAKE 1 TABLET BY MOUTH TWICE DAILY BEFORE a meal 60 tablet 3   vitamin C (ASCORBIC ACID) 250 MG tablet Take 250 mg by mouth daily.     gabapentin  (NEURONTIN ) 300 MG capsule Take 1 capsule (300 mg total) by mouth 3  (three) times daily. 90 capsule 3   cholestyramine  (QUESTRAN ) 4 GM/DOSE powder Take 1 packet (4 g total) by mouth daily. Mix with 4-6 oz liquid. Take other meds 1 hr before or 4-6 hr after cholestyramine . 270 g 5   dextromethorphan -guaiFENesin  (ROBITUSSIN-DM) 10-100 MG/5ML liquid Take 10 mLs by mouth every 4 (four) hours as needed for cough. (Patient not taking: Reported on 12/26/2023) 236 mL 2   Fluticasone  Furoate (ARNUITY ELLIPTA ) 100 MCG/ACT AEPB Inhale  1 puff into the lungs daily. (Patient not taking: Reported on 12/26/2023) 30 each 4   No facility-administered medications prior to visit.    ROS Reviewed all systems and reported negative except as above     Objective:   Vitals:   12/26/23 1522  BP: (!) 140/74  Pulse: 100  SpO2: 99%  Weight: 187 lb 9.6 oz (85.1 kg)  Height: 5' 11 (1.803 m)    Physical Exam Physical Exam GENERAL: Appropriate to age, no acute distress. HEAD EYES EARS NOSE THROAT: Moist mucous membranes, atraumatic, normocephalic. CHEST: Clear to auscultation bilaterally, no wheezing, no crackles, no rales CARDIAC: Regular rate and rhythm, normal S1, normal S2, no murmurs, no rubs, no gallops. ABDOMEN: Soft, nontender. NEUROLOGICAL: Motor and sensation grossly intact, alert and oriented times X 3. EXTREMITIES: Warm, well perfused, no edema.     CBC    Component Value Date/Time   WBC 8.1 11/17/2023 1641   RBC 4.05 (L) 11/17/2023 1641   HGB 12.5 (L) 11/17/2023 1641   HCT 36.3 (L) 11/17/2023 1641   PLT 217.0 11/17/2023 1641   MCV 89.6 11/17/2023 1641   MCH 31.0 09/15/2023 0500   MCHC 34.5 11/17/2023 1641   RDW 13.5 11/17/2023 1641   LYMPHSABS 2.0 11/17/2023 1641   MONOABS 0.7 11/17/2023 1641   EOSABS 0.3 11/17/2023 1641   BASOSABS 0.2 (H) 11/17/2023 1641     Chest imaging:  PFT:    Latest Ref Rng & Units 12/26/2023   12:48 PM  PFT Results  FVC-Pre L 4.21   FVC-Predicted Pre % 81   FVC-Post L 4.28   FVC-Predicted Post % 83   Pre  FEV1/FVC % % 81   Post FEV1/FCV % % 83   FEV1-Pre L 3.39   FEV1-Predicted Pre % 85   FEV1-Post L 3.54   DLCO uncorrected ml/min/mmHg 27.53   DLCO UNC% % 92   DLCO corrected ml/min/mmHg 29.43   DLCO COR %Predicted % 98   DLVA Predicted % 110   TLC L 6.88   TLC % Predicted % 95   RV % Predicted % 126          Assessment & Plan:   Assessment and Plan Assessment & Plan Chronic cough and throat irritation with esophageal dysmotility and gastroesophageal reflux disease Chronic cough and throat irritation persist due to esophageal dysmotility and gastroesophageal reflux disease. Gabapentin  provides partial relief. No lung pathology on imaging and pulmonary function tests. Symptoms exacerbated by allergens and possible upper airway inflammation. - Continue gabapentin  300 mg at night. - Continue pantoprazole . - Referred to gastroenterologist for esophageal dysmotility evaluation. - Advised to avoid environmental triggers.  Seasonal allergic rhinitis due to environmental and animal allergens Significant allergic burden from cat, dog dander, and various grasses. Current medications ineffective. Discussed allergy  shots as a treatment option. - Continue Zyrtec  daily. - Referred to allergist for evaluation and potential allergy  shots. - Advised to remove or relocate cats. - Discontinue chlorpheniramine  and Singulair if ineffective.        Zola Herter, MD [facility] Office: [phone]        [1]  Allergies Allergen Reactions   Prednisone Other (See Comments)    Uratic behavior    "

## 2023-12-26 NOTE — Patient Instructions (Signed)
 Full PFT performed today.

## 2023-12-26 NOTE — Progress Notes (Signed)
 Full PFT performed today.

## 2024-01-08 ENCOUNTER — Other Ambulatory Visit: Payer: Self-pay | Admitting: Internal Medicine

## 2024-01-08 DIAGNOSIS — K219 Gastro-esophageal reflux disease without esophagitis: Secondary | ICD-10-CM

## 2024-01-09 NOTE — Telephone Encounter (Signed)
Pt needs appt before further refills

## 2024-02-03 ENCOUNTER — Ambulatory Visit: Admitting: Allergy

## 2024-02-13 ENCOUNTER — Ambulatory Visit: Admitting: Gastroenterology

## 2024-02-15 ENCOUNTER — Ambulatory Visit: Admitting: Allergy

## 2024-02-21 ENCOUNTER — Ambulatory Visit: Admitting: "Endocrinology

## 2024-03-14 ENCOUNTER — Ambulatory Visit: Admitting: Allergy

## 2024-04-24 ENCOUNTER — Ambulatory Visit (HOSPITAL_COMMUNITY)

## 2024-04-24 ENCOUNTER — Ambulatory Visit: Admitting: Vascular Surgery
